# Patient Record
Sex: Female | Born: 1985 | Race: White | Hispanic: No | Smoking: Never smoker
Health system: Southern US, Community
[De-identification: ages and names within clinical notes are randomized; demographics above are authoritative.]

## PROBLEM LIST (undated history)

## (undated) DIAGNOSIS — F419 Anxiety disorder, unspecified: Secondary | ICD-10-CM

## (undated) DIAGNOSIS — I10 Essential (primary) hypertension: Secondary | ICD-10-CM

## (undated) HISTORY — DX: Essential (primary) hypertension: I10

## (undated) HISTORY — DX: Anxiety disorder, unspecified: F41.9

---

## 2017-03-29 ENCOUNTER — Ambulatory Visit (INDEPENDENT_AMBULATORY_CARE_PROVIDER_SITE_OTHER): Payer: No Typology Code available for payment source | Admitting: Obstetrics & Gynecology

## 2017-03-29 ENCOUNTER — Encounter: Payer: Self-pay | Admitting: Obstetrics & Gynecology

## 2017-03-29 VITALS — BP 152/90 | Ht 66.5 in | Wt 181.0 lb

## 2017-03-29 DIAGNOSIS — Z01419 Encounter for gynecological examination (general) (routine) without abnormal findings: Secondary | ICD-10-CM | POA: Diagnosis not present

## 2017-03-29 DIAGNOSIS — Z1151 Encounter for screening for human papillomavirus (HPV): Secondary | ICD-10-CM | POA: Diagnosis not present

## 2017-03-29 DIAGNOSIS — Z308 Encounter for other contraceptive management: Secondary | ICD-10-CM

## 2017-03-29 NOTE — Progress Notes (Signed)
Michele Cunningham M Haros [birth year] [id number]   History:    32 y.o. G0  Married x 2 years.  RP:  New patient presenting for annual gyn exam   HPI: Menstrual periods regular normal every month.  No contraception, would be fine if conceived, but not actively trying.  No pelvic pain. Normal vaginal secretions.  Urine/BMs wnl. Normal Paps in the past, overdue for pap.  No h/o STI.  Breasts wnl.  C/O new onset episodes of SOB/palpitations/anxiety at work which resolve by calming herself down. BMI 28.78.  Rather sedentary.  BP high today, no h/o cHTN, but mother with cHTN.  Father with Cardiac disease.  No Fam h/o Breast Ca, Ovarian or Colon Ca.  Past medical history,surgical history, family history and social history were all reviewed and documented in the EPIC chart.  Gynecologic History Patient's last menstrual period was 03/20/2017. Contraception: none Last Pap: Overdue, normal in the past Last mammogram: Normal Bone Density: Normal Colonoscopy: Normal  Obstetric History OB History  Gravida Para Term Preterm AB Living  0 0 0 0 0 0  SAB TAB Ectopic Multiple Live Births  0 0 0 0 0         ROS: A ROS was performed and pertinent positives and negatives are included in the history.  GENERAL: No fevers or chills. HEENT: No change in vision, no earache, sore throat or sinus congestion. NECK: No pain or stiffness. CARDIOVASCULAR: No chest pain or pressure. No palpitations. PULMONARY: No shortness of breath, cough or wheeze. GASTROINTESTINAL: No abdominal pain, nausea, vomiting or diarrhea, melena or bright red blood per rectum. GENITOURINARY: No urinary frequency, urgency, hesitancy or dysuria. MUSCULOSKELETAL: No joint or muscle pain, no back pain, no recent trauma. DERMATOLOGIC: No rash, no itching, no lesions. ENDOCRINE: No polyuria, polydipsia, no heat or cold intolerance. No recent change in weight. HEMATOLOGICAL: No anemia or easy bruising or bleeding. NEUROLOGIC: No headache, seizures, numbness,  tingling or weakness. PSYCHIATRIC: No depression, no loss of interest in normal activity or change in sleep pattern.     Exam:   BP (!) 158/90   Ht 5' 6.5" (1.689 m)   Wt 181 lb (82.1 kg)   LMP 03/20/2017 Comment: no birth control   BMI 28.78 kg/m   Body mass index is 28.78 kg/m.  General appearance : Well developed well nourished female. No acute distress HEENT: Eyes: no retinal hemorrhage or exudates,  Neck supple, trachea midline, no carotid bruits, no thyroidmegaly Lungs: Clear to auscultation, no rhonchi or wheezes, or rib retractions  Heart: Regular rate and rhythm, no murmurs or gallops Breast:Examined in sitting and supine position were symmetrical in appearance, no palpable masses or tenderness,  no skin retraction, no nipple inversion, no nipple discharge, no skin discoloration, no axillary or supraclavicular lymphadenopathy Abdomen: no palpable masses or tenderness, no rebound or guarding Extremities: no edema or skin discoloration or tenderness  Pelvic: Vulva: Normal             Vagina: No gross lesions or discharge  Cervix: No gross lesions or discharge.  Pap/HR HPV done  Uterus  AV, normal size, shape and consistency, non-tender and mobile  Adnexa  Without masses or tenderness  Anus: Normal   Assessment/Plan:  32 y.o. female for annual exam   1. Encounter for routine gynecological examination with Papanicolaou smear of cervix Normal gynecologic exam.  Pap with high risk HPV done.  Breast exam normal.  Health labs with family physician. - PAP,TP IMGw/HPV RNA,rflx [id number],78/[id number],18/45  2. Special screening examination for human papillomavirus (HPV)  - PAP,TP IMGw/HPV RNA,rflx [id number],18/45  3. Encounter for other contraceptive management Declines contraception after counseling.  Patient is not actively trying to conceive but will welcome the pregnancy.  Genia Del MD, 2:14 PM 03/29/2017

## 2017-03-31 LAB — PAP, TP IMAGING W/ HPV RNA, RFLX HPV TYPE 16,18/45: HPV DNA High Risk: NOT DETECTED

## 2017-04-03 ENCOUNTER — Encounter: Payer: Self-pay | Admitting: Obstetrics & Gynecology

## 2017-04-03 NOTE — Patient Instructions (Signed)
1. Encounter for routine gynecological examination with Papanicolaou smear of cervix Normal gynecologic exam.  Pap with high risk HPV done.  Breast exam normal.  Health labs with family physician. - PAP,TP IMGw/HPV RNA,rflx [id number],17/40  2. Special screening examination for human papillomavirus (HPV)  - PAP,TP IMGw/HPV RNA,rflx [id number],31/49  3. Encounter for other contraceptive management Declines contraception after counseling.  Patient is not actively trying to conceive but will welcome the pregnancy.  Michele Cunningham, it was a pleasure meeting you today!  I will inform you of your results as soon as they are available.  Health Maintenance, Female Adopting a healthy lifestyle and getting preventive care can go a long way to promote health and wellness. Talk with your health care provider about what schedule of regular examinations is right for you. This is a good chance for you to check in with your provider about disease prevention and staying healthy. In between checkups, there are plenty of things you can do on your own. Experts have done a lot of research about which lifestyle changes and preventive measures are most likely to keep you healthy. Ask your health care provider for more information. Weight and diet Eat a healthy diet  Be sure to include plenty of vegetables, fruits, low-fat dairy products, and lean protein.  Do not eat a lot of foods high in solid fats, added sugars, or salt.  Get regular exercise. This is one of the most important things you can do for your health. ? Most adults should exercise for at least 150 minutes each week. The exercise should increase your heart rate and make you sweat (moderate-intensity exercise). ? Most adults should also do strengthening exercises at least twice a week. This is in addition to the moderate-intensity exercise.  Maintain a healthy weight  Body mass index (BMI) is a measurement that can be used to identify possible weight problems.  It estimates body fat based on height and weight. Your health care provider can help determine your BMI and help you achieve or maintain a healthy weight.  For females 80 years of age and older: ? A BMI below 18.5 is considered underweight. ? A BMI of 18.5 to 24.9 is normal. ? A BMI of 25 to 29.9 is considered overweight. ? A BMI of 30 and above is considered obese.  Watch levels of cholesterol and blood lipids  You should start having your blood tested for lipids and cholesterol at 34 years of age, then have this test every 5 years.  You may need to have your cholesterol levels checked more often if: ? Your lipid or cholesterol levels are high. ? You are older than 34 years of age. ? You are at high risk for heart disease.  Cancer screening Lung Cancer  Lung cancer screening is recommended for adults 67-13 years old who are at high risk for lung cancer because of a history of smoking.  A yearly low-dose CT scan of the lungs is recommended for people who: ? Currently smoke. ? Have quit within the past 15 years. ? Have at least a 30-pack-year history of smoking. A pack year is smoking an average of one pack of cigarettes a day for 1 year.  Yearly screening should continue until it has been 15 years since you quit.  Yearly screening should stop if you develop a health problem that would prevent you from having lung cancer treatment.  Breast Cancer  Practice breast self-awareness. This means understanding how your breasts normally appear and feel.  It also means doing regular breast self-exams. Let your health care provider know about any changes, no matter how small.  If you are in your 20s or 30s, you should have a clinical breast exam (CBE) by a health care provider every 1-3 years as part of a regular health exam.  If you are 57 or older, have a CBE every year. Also consider having a breast X-ray (mammogram) every year.  If you have a family history of breast cancer, talk to  your health care provider about genetic screening.  If you are at high risk for breast cancer, talk to your health care provider about having an MRI and a mammogram every year.  Breast cancer gene (BRCA) assessment is recommended for women who have family members with BRCA-related cancers. BRCA-related cancers include: ? Breast. ? Ovarian. ? Tubal. ? Peritoneal cancers.  Results of the assessment will determine the need for genetic counseling and BRCA1 and BRCA2 testing.  Cervical Cancer Your health care provider may recommend that you be screened regularly for cancer of the pelvic organs (ovaries, uterus, and vagina). This screening involves a pelvic examination, including checking for microscopic changes to the surface of your cervix (Pap test). You may be encouraged to have this screening done every 3 years, beginning at age 64.  For women ages 64-65, health care providers may recommend pelvic exams and Pap testing every 3 years, or they may recommend the Pap and pelvic exam, combined with testing for human papilloma virus (HPV), every 5 years. Some types of HPV increase your risk of cervical cancer. Testing for HPV may also be done on women of any age with unclear Pap test results.  Other health care providers may not recommend any screening for nonpregnant women who are considered low risk for pelvic cancer and who do not have symptoms. Ask your health care provider if a screening pelvic exam is right for you.  If you have had past treatment for cervical cancer or a condition that could lead to cancer, you need Pap tests and screening for cancer for at least 20 years after your treatment. If Pap tests have been discontinued, your risk factors (such as having a new sexual partner) need to be reassessed to determine if screening should resume. Some women have medical problems that increase the chance of getting cervical cancer. In these cases, your health care provider may recommend more  frequent screening and Pap tests.  Colorectal Cancer  This type of cancer can be detected and often prevented.  Routine colorectal cancer screening usually begins at 34 years of age and continues through 34 years of age.  Your health care provider may recommend screening at an earlier age if you have risk factors for colon cancer.  Your health care provider may also recommend using home test kits to check for hidden blood in the stool.  A small camera at the end of a tube can be used to examine your colon directly (sigmoidoscopy or colonoscopy). This is done to check for the earliest forms of colorectal cancer.  Routine screening usually begins at age 4.  Direct examination of the colon should be repeated every 5-10 years through 34 years of age. However, you may need to be screened more often if early forms of precancerous polyps or small growths are found.  Skin Cancer  Check your skin from head to toe regularly.  Tell your health care provider about any new moles or changes in moles, especially if there is a  change in a mole's shape or color.  Also tell your health care provider if you have a mole that is larger than the size of a pencil eraser.  Always use sunscreen. Apply sunscreen liberally and repeatedly throughout the day.  Protect yourself by wearing long sleeves, pants, a wide-brimmed hat, and sunglasses whenever you are outside.  Heart disease, diabetes, and high blood pressure  High blood pressure causes heart disease and increases the risk of stroke. High blood pressure is more likely to develop in: ? People who have blood pressure in the high end of the normal range (130-139/85-89 mm Hg). ? People who are overweight or obese. ? People who are African American.  If you are 72-35 years of age, have your blood pressure checked every 3-5 years. If you are 90 years of age or older, have your blood pressure checked every year. You should have your blood pressure measured  twice-once when you are at a hospital or clinic, and once when you are not at a hospital or clinic. Record the average of the two measurements. To check your blood pressure when you are not at a hospital or clinic, you can use: ? An automated blood pressure machine at a pharmacy. ? A home blood pressure monitor.  If you are between 79 years and 11 years old, ask your health care provider if you should take aspirin to prevent strokes.  Have regular diabetes screenings. This involves taking a blood sample to check your fasting blood sugar level. ? If you are at a normal weight and have a low risk for diabetes, have this test once every three years after 34 years of age. ? If you are overweight and have a high risk for diabetes, consider being tested at a younger age or more often. Preventing infection Hepatitis B  If you have a higher risk for hepatitis B, you should be screened for this virus. You are considered at high risk for hepatitis B if: ? You were born in a country where hepatitis B is common. Ask your health care provider which countries are considered high risk. ? Your parents were born in a high-risk country, and you have not been immunized against hepatitis B (hepatitis B vaccine). ? You have HIV or AIDS. ? You use needles to inject street drugs. ? You live with someone who has hepatitis B. ? You have had sex with someone who has hepatitis B. ? You get hemodialysis treatment. ? You take certain medicines for conditions, including cancer, organ transplantation, and autoimmune conditions.  Hepatitis C  Blood testing is recommended for: ? Everyone born from [birth year] through 1967. ? Anyone with known risk factors for hepatitis C.  Sexually transmitted infections (STIs)  You should be screened for sexually transmitted infections (STIs) including gonorrhea and chlamydia if: ? You are sexually active and are younger than 34 years of age. ? You are older than 34 years of age and your  health care provider tells you that you are at risk for this type of infection. ? Your sexual activity has changed since you were last screened and you are at an increased risk for chlamydia or gonorrhea. Ask your health care provider if you are at risk.  If you do not have HIV, but are at risk, it may be recommended that you take a prescription medicine daily to prevent HIV infection. This is called pre-exposure prophylaxis (PrEP). You are considered at risk if: ? You are sexually active and do not regularly  use condoms or know the HIV status of your partner(s). ? You take drugs by injection. ? You are sexually active with a partner who has HIV.  Talk with your health care provider about whether you are at high risk of being infected with HIV. If you choose to begin PrEP, you should first be tested for HIV. You should then be tested every 3 months for as long as you are taking PrEP. Pregnancy  If you are premenopausal and you may become pregnant, ask your health care provider about preconception counseling.  If you may become pregnant, take 400 to 800 micrograms (mcg) of folic acid every day.  If you want to prevent pregnancy, talk to your health care provider about birth control (contraception). Osteoporosis and menopause  Osteoporosis is a disease in which the bones lose minerals and strength with aging. This can result in serious bone fractures. Your risk for osteoporosis can be identified using a bone density scan.  If you are 16 years of age or older, or if you are at risk for osteoporosis and fractures, ask your health care provider if you should be screened.  Ask your health care provider whether you should take a calcium or vitamin D supplement to lower your risk for osteoporosis.  Menopause may have certain physical symptoms and risks.  Hormone replacement therapy may reduce some of these symptoms and risks. Talk to your health care provider about whether hormone replacement  therapy is right for you. Follow these instructions at home:  Schedule regular health, dental, and eye exams.  Stay current with your immunizations.  Do not use any tobacco products including cigarettes, chewing tobacco, or electronic cigarettes.  If you are pregnant, do not drink alcohol.  If you are breastfeeding, limit how much and how often you drink alcohol.  Limit alcohol intake to no more than 1 drink per day for nonpregnant women. One drink equals 12 ounces of beer, 5 ounces of wine, or 1 ounces of hard liquor.  Do not use street drugs.  Do not share needles.  Ask your health care provider for help if you need support or information about quitting drugs.  Tell your health care provider if you often feel depressed.  Tell your health care provider if you have ever been abused or do not feel safe at home. This information is not intended to replace advice given to you by your health care provider. Make sure you discuss any questions you have with your health care provider. Document Released: 07/13/2010 Document Revised: 06/05/2015 Document Reviewed: 10/01/2014 [employer]vier Interactive Patient Education  [employer].

## 2017-10-27 ENCOUNTER — Ambulatory Visit (INDEPENDENT_AMBULATORY_CARE_PROVIDER_SITE_OTHER): Payer: No Typology Code available for payment source | Admitting: Physician Assistant

## 2017-10-27 ENCOUNTER — Other Ambulatory Visit: Payer: Self-pay

## 2017-10-27 VITALS — BP 188/110 | HR 97 | Temp 98.0°F | Resp 16 | Ht 66.0 in | Wt 185.0 lb

## 2017-10-27 DIAGNOSIS — I10 Essential (primary) hypertension: Secondary | ICD-10-CM | POA: Diagnosis not present

## 2017-10-27 LAB — POCT CBC
Granulocyte percent: 69.4 % (ref 37–80)
HCT, POC: 42.3 % — AB (ref 29–41)
Hemoglobin: 14.1 g/dL — AB (ref 9.5–13.5)
Lymph, poc: 1.9 (ref 0.6–3.4)
MCH, POC: 29 pg (ref 27–31.2)
MCHC: 33.3 g/dL (ref 31.8–35.4)
MCV: 87.1 fL (ref 76–111)
MID (cbc): 0.7 (ref 0–0.9)
MPV: 7.3 fL (ref 0–99.8)
POC Granulocyte: 6 (ref 2–6.9)
POC LYMPH PERCENT: 22.4 %L (ref 10–50)
POC MID %: 8.2 %M (ref 0–12)
Platelet Count, POC: 396 10*3/uL (ref 142–424)
RBC: 4.85 M/uL (ref 4.04–5.48)
RDW, POC: 13.4 %
WBC: 8.7 10*3/uL (ref 4.6–10.2)

## 2017-10-27 LAB — POCT URINALYSIS DIP (MANUAL ENTRY)
Bilirubin, UA: NEGATIVE
Glucose, UA: NEGATIVE mg/dL
Ketones, POC UA: NEGATIVE mg/dL
Leukocytes, UA: NEGATIVE
Nitrite, UA: NEGATIVE
Protein Ur, POC: NEGATIVE mg/dL
Spec Grav, UA: 1.01 (ref 1.010–1.025)
Urobilinogen, UA: 0.2 E.U./dL
pH, UA: 6.5 (ref 5.0–8.0)

## 2017-10-27 LAB — POCT GLYCOSYLATED HEMOGLOBIN (HGB A1C): Hemoglobin A1C: 5.2 % (ref 4.0–5.6)

## 2017-10-27 MED ORDER — LISINOPRIL-HYDROCHLOROTHIAZIDE 10-12.5 MG PO TABS
1.0000 | ORAL_TABLET | Freq: Every day | ORAL | 3 refills | Status: DC
Start: 2017-10-27 — End: 2017-11-16

## 2017-10-27 NOTE — Patient Instructions (Addendum)
Start taking lisinopril-hydrochlorothiazide 10-12.5 MG tablet; Take 1 tablet by mouth daily. Try to take your blood pressure readings at least 2x/week until I see you next. Document these readings and bring them with you at your next visit.  Come back and see me in 1-2 weeks.    Hypertension Hypertension, commonly called high blood pressure, is when the force of blood pumping through the arteries is too strong. The arteries are the blood vessels that carry blood from the heart throughout the body. Hypertension forces the heart to work harder to pump blood and may cause arteries to become narrow or stiff. Having untreated or uncontrolled hypertension can cause heart attacks, strokes, kidney disease, and other problems. A blood pressure reading consists of a higher number over a lower number. Ideally, your blood pressure should be below 120/80. The first ("top") number is called the systolic pressure. It is a measure of the pressure in your arteries as your heart beats. The second ("bottom") number is called the diastolic pressure. It is a measure of the pressure in your arteries as the heart relaxes. What are the causes? The cause of this condition is not known. What increases the risk? Some risk factors for high blood pressure are under your control. Others are not. Factors you can change  Smoking.  Having type 2 diabetes mellitus, high cholesterol, or both.  Not getting enough exercise or physical activity.  Being overweight.  Having too much fat, sugar, calories, or salt (sodium) in your diet.  Drinking too much alcohol. Factors that are difficult or impossible to change  Having chronic kidney disease.  Having a family history of high blood pressure.  Age. Risk increases with age.  Race. You may be at higher risk if you are African-American.  Gender. Men are at higher risk than women before age 12. After age 61, women are at higher risk than men.  Having obstructive sleep  apnea.  Stress. What are the signs or symptoms? Extremely high blood pressure (hypertensive crisis) may cause:  Headache.  Anxiety.  Shortness of breath.  Nosebleed.  Nausea and vomiting.  Severe chest pain.  Jerky movements you cannot control (seizures).  How is this treated? This condition is treated by making healthy lifestyle changes, such as eating healthy foods, exercising more, and reducing your alcohol intake. Your health care provider may prescribe medicine if lifestyle changes are not enough to get your blood pressure under control, and if:  Your systolic blood pressure is above 130.  Your diastolic blood pressure is above 80.  Your personal target blood pressure may vary depending on your medical conditions, your age, and other factors. Follow these instructions at home: Eating and drinking  Eat a diet that is high in fiber and potassium, and low in sodium, added sugar, and fat. An example eating plan is called the DASH (Dietary Approaches to Stop Hypertension) diet. To eat this way: ? Eat plenty of fresh fruits and vegetables. Try to fill half of your plate at each meal with fruits and vegetables. ? Eat whole grains, such as whole wheat pasta, brown rice, or whole grain bread. Fill about one quarter of your plate with whole grains. ? Eat or drink low-fat dairy products, such as skim milk or low-fat yogurt. ? Avoid fatty cuts of meat, processed or cured meats, and poultry with skin. Fill about one quarter of your plate with lean proteins, such as fish, chicken without skin, beans, eggs, and tofu. ? Avoid premade and processed foods. These  tend to be higher in sodium, added sugar, and fat.  Reduce your daily sodium intake. Most people with hypertension should eat less than 1,500 mg of sodium a day.  Limit alcohol intake to no more than 1 drink a day for nonpregnant women and 2 drinks a day for men. One drink equals 12 oz of beer, 5 oz of wine, or 1 oz of hard  liquor. Lifestyle  Work with your health care provider to maintain a healthy body weight or to lose weight. Ask what an ideal weight is for you.  Get at least 30 minutes of exercise that causes your heart to beat faster (aerobic exercise) most days of the week. Activities may include walking, swimming, or biking.  Include exercise to strengthen your muscles (resistance exercise), such as pilates or lifting weights, as part of your weekly exercise routine. Try to do these types of exercises for 30 minutes at least 3 days a week.  Do not use any products that contain nicotine or tobacco, such as cigarettes and e-cigarettes. If you need help quitting, ask your health care provider.  Monitor your blood pressure at home as told by your health care provider.  Keep all follow-up visits as told by your health care provider. This is important. Medicines  Take over-the-counter and prescription medicines only as told by your health care provider. Follow directions carefully. Blood pressure medicines must be taken as prescribed.  Do not skip doses of blood pressure medicine. Doing this puts you at risk for problems and can make the medicine less effective.  Ask your health care provider about side effects or reactions to medicines that you should watch for. Contact a health care provider if:  You think you are having a reaction to a medicine you are taking.  You have headaches that keep coming back (recurring).  You feel dizzy.  You have swelling in your ankles.  You have trouble with your vision. Get help right away if:  You develop a severe headache or confusion.  You have unusual weakness or numbness.  You feel faint.  You have severe pain in your chest or abdomen.  You vomit repeatedly.  You have trouble breathing. Summary  Hypertension is when the force of blood pumping through your arteries is too strong. If this condition is not controlled, it may put you at risk for serious  complications.  Your personal target blood pressure may vary depending on your medical conditions, your age, and other factors. For most people, a normal blood pressure is less than 120/80.  Hypertension is treated with lifestyle changes, medicines, or a combination of both. Lifestyle changes include weight loss, eating a healthy, low-sodium diet, exercising more, and limiting alcohol. This information is not intended to replace advice given to you by your health care provider. Make sure you discuss any questions you have with your health care provider. Document Released: 12/28/2004 Document Revised: 11/26/2015 Document Reviewed: 11/26/2015 Elsevier Interactive Patient Education  2018 [employer].   DASH Eating Plan DASH stands for "Dietary Approaches to Stop Hypertension." The DASH eating plan is a healthy eating plan that has been shown to reduce high blood pressure (hypertension). It may also reduce your risk for type 2 diabetes, heart disease, and stroke. The DASH eating plan may also help with weight loss. What are tips for following this plan? General guidelines  Avoid eating more than 2,300 mg (milligrams) of salt (sodium) a day. If you have hypertension, you may need to reduce your sodium  intake to 1,500 mg a day.  Limit alcohol intake to no more than 1 drink a day for nonpregnant women and 2 drinks a day for men. One drink equals 12 oz of beer, 5 oz of wine, or 1 oz of hard liquor.  Work with your health care provider to maintain a healthy body weight or to lose weight. Ask what an ideal weight is for you.  Get at least 30 minutes of exercise that causes your heart to beat faster (aerobic exercise) most days of the week. Activities may include walking, swimming, or biking.  Work with your health care provider or diet and nutrition specialist (dietitian) to adjust your eating plan to your individual calorie needs. Reading food labels  Check food labels for the amount of sodium  per serving. Choose foods with less than 5 percent of the Daily Value of sodium. Generally, foods with less than 300 mg of sodium per serving fit into this eating plan.  To find whole grains, look for the word "whole" as the first word in the ingredient list. Shopping  Buy products labeled as "low-sodium" or "no salt added."  Buy fresh foods. Avoid canned foods and premade or frozen meals. Cooking  Avoid adding salt when cooking. Use salt-free seasonings or herbs instead of table salt or sea salt. Check with your health care provider or pharmacist before using salt substitutes.  Do not fry foods. Cook foods using healthy methods such as baking, boiling, grilling, and broiling instead.  Cook with heart-healthy oils, such as olive, canola, soybean, or sunflower oil. Meal planning   Eat a balanced diet that includes: ? 5 or more servings of fruits and vegetables each day. At each meal, try to fill half of your plate with fruits and vegetables. ? Up to 6-8 servings of whole grains each day. ? Less than 6 oz of lean meat, poultry, or fish each day. A 3-oz serving of meat is about the same size as a deck of cards. One egg equals 1 oz. ? 2 servings of low-fat dairy each day. ? A serving of nuts, seeds, or beans 5 times each week. ? Heart-healthy fats. Healthy fats called Omega-3 fatty acids are found in foods such as flaxseeds and coldwater fish, like sardines, salmon, and mackerel.  Limit how much you eat of the following: ? Canned or prepackaged foods. ? Food that is high in trans fat, such as fried foods. ? Food that is high in saturated fat, such as fatty meat. ? Sweets, desserts, sugary drinks, and other foods with added sugar. ? Full-fat dairy products.  Do not salt foods before eating.  Try to eat at least 2 vegetarian meals each week.  Eat more home-cooked food and less restaurant, buffet, and fast food.  When eating at a restaurant, ask that your food be prepared with less  salt or no salt, if possible. What foods are recommended? The items listed may not be a complete list. Talk with your dietitian about what dietary choices are best for you. Grains Whole-grain or whole-wheat bread. Whole-grain or whole-wheat pasta. Brown rice. Orpah Cobb. Bulgur. Whole-grain and low-sodium cereals. Pita bread. Low-fat, low-sodium crackers. Whole-wheat flour tortillas. Vegetables Fresh or frozen vegetables (raw, steamed, roasted, or grilled). Low-sodium or reduced-sodium tomato and vegetable juice. Low-sodium or reduced-sodium tomato sauce and tomato paste. Low-sodium or reduced-sodium canned vegetables. Fruits All fresh, dried, or frozen fruit. Canned fruit in natural juice (without added sugar). Meat and other protein foods Skinless chicken or [place].  Ground chicken or [place]. Pork with fat trimmed off. Fish and seafood. Egg whites. Dried beans, peas, or lentils. Unsalted nuts, nut butters, and seeds. Unsalted canned beans. Lean cuts of beef with fat trimmed off. Low-sodium, lean deli meat. Dairy Low-fat (1%) or fat-free (skim) milk. Fat-free, low-fat, or reduced-fat cheeses. Nonfat, low-sodium ricotta or cottage cheese. Low-fat or nonfat yogurt. Low-fat, low-sodium cheese. Fats and oils Soft margarine without trans fats. Vegetable oil. Low-fat, reduced-fat, or light mayonnaise and salad dressings (reduced-sodium). Canola, safflower, olive, soybean, and sunflower oils. Avocado. Seasoning and other foods Herbs. Spices. Seasoning mixes without salt. Unsalted popcorn and pretzels. Fat-free sweets. What foods are not recommended? The items listed may not be a complete list. Talk with your dietitian about what dietary choices are best for you. Grains Baked goods made with fat, such as croissants, muffins, or some breads. Dry pasta or rice meal packs. Vegetables Creamed or fried vegetables. Vegetables in a cheese sauce. Regular canned vegetables (not low-sodium or  reduced-sodium). Regular canned tomato sauce and paste (not low-sodium or reduced-sodium). Regular tomato and vegetable juice (not low-sodium or reduced-sodium). Rosita Fire. Olives. Fruits Canned fruit in a light or heavy syrup. Fried fruit. Fruit in cream or butter sauce. Meat and other protein foods Fatty cuts of meat. Ribs. Fried meat. Tomasa Blase. Sausage. Bologna and other processed lunch meats. Salami. Fatback. Hotdogs. Bratwurst. Salted nuts and seeds. Canned beans with added salt. Canned or smoked fish. Whole eggs or egg yolks. Chicken or [place] with skin. Dairy Whole or 2% milk, cream, and half-and-half. Whole or full-fat cream cheese. Whole-fat or sweetened yogurt. Full-fat cheese. Nondairy creamers. Whipped toppings. Processed cheese and cheese spreads. Fats and oils Butter. Stick margarine. Lard. Shortening. Ghee. Bacon fat. Tropical oils, such as coconut, palm kernel, or palm oil. Seasoning and other foods Salted popcorn and pretzels. Onion salt, garlic salt, seasoned salt, table salt, and sea salt. Worcestershire sauce. Tartar sauce. Barbecue sauce. Teriyaki sauce. Soy sauce, including reduced-sodium. Steak sauce. Canned and packaged gravies. Fish sauce. Oyster sauce. Cocktail sauce. Horseradish that you find on the shelf. Ketchup. Mustard. Meat flavorings and tenderizers. Bouillon cubes. Hot sauce and Tabasco sauce. Premade or packaged marinades. Premade or packaged taco seasonings. Relishes. Regular salad dressings. Where to find more information:  National Heart, Lung, and Blood Institute: PopSteam.is  American Heart Association: www.heart.org Summary  The DASH eating plan is a healthy eating plan that has been shown to reduce high blood pressure (hypertension). It may also reduce your risk for type 2 diabetes, heart disease, and stroke.  With the DASH eating plan, you should limit salt (sodium) intake to 2,300 mg a day. If you have hypertension, you may need to reduce your sodium  intake to 1,500 mg a day.  When on the DASH eating plan, aim to eat more fresh fruits and vegetables, whole grains, lean proteins, low-fat dairy, and heart-healthy fats.  Work with your health care provider or diet and nutrition specialist (dietitian) to adjust your eating plan to your individual calorie needs. This information is not intended to replace advice given to you by your health care provider. Make sure you discuss any questions you have with your health care provider. Document Released: 12/17/2010 Document Revised: 12/22/2015 Document Reviewed: 12/22/2015 Elsevier Interactive Patient Education  [employer].   If you have lab work done today you will be contacted with your lab results within the next 2 weeks.  If you have not heard from [place] then please contact us. The fastest way to  get your results is to register for My Chart.   IF you received an x-ray today, you will receive an invoice from [facility]. Please contact [facility] at [phone] with questions or concerns regarding your invoice.   IF you received labwork today, you will receive an invoice from [facility]. Please contact [facility] at [phone] with questions or concerns regarding your invoice.   Our billing staff will not be able to assist you with questions regarding bills from these companies.  You will be contacted with the lab results as soon as they are available. The fastest way to get your results is to activate your My Chart account. Instructions are located on the last page of this paperwork. If you have not heard from [place] regarding the results in 2 weeks, please contact this office.

## 2017-10-27 NOTE — Progress Notes (Signed)
   Michele Cunningham  MRN: [id number] DOB: 15-Jul-1985  PCP: Patient, No Pcp Per  Subjective:  Pt is a 32 year old female who presents to clinic for HA, blurry vision and anxiety.   Blurry vision x 1-2 months. "Looked like something was floating and it got really bad".  Migraines usually over right eye. Never on left side. Happens about 3x/month. Lasts about 8 hours.  She has h/o high blood pressure - [facility] and was told "blood pressure was a little high". Prior to that does not recall ever being told about elevated blood pressure.    Elevated blood pressure reading with GYN 03/2017 of 152/90 No medications.  Diet: not that healthy. Chicken, okra, pizza, pasta. Cooks at home. Occasional canned soup.  Non-smoker.  No big stressors in her life. Drinks alcohol on weekends mostly. Beer, wine, liquor.    FHx - mother HTN. Sister "I don't think she has high blood pressure"   Pt  has no past medical history on file.  Review of Systems  Eyes: Positive for visual disturbance.  Cardiovascular: Negative for chest pain, palpitations and leg swelling.  Neurological: Positive for headaches. Negative for dizziness and light-headedness.    There are no active problems to display for this patient.   Current Outpatient Medications on File Prior to Visit  Medication Sig Dispense Refill  . Multiple Vitamins-Minerals (HAIR/SKIN/NAILS/BIOTIN PO) Take by mouth.     No current facility-administered medications on file prior to visit.     No Known Allergies   Objective:  BP (!) 188/110   Pulse 97   Temp 98 F (36.7 C) (Oral)   Resp 16   Ht _0  (1.676 m)   Wt 185 lb (83.9 kg)   LMP 10/22/2017   SpO2 98%   BMI 29.86 kg/m   Physical Exam  Constitutional: She is oriented to person, place, and time. No distress.  Cardiovascular: Normal rate, regular rhythm and normal heart sounds.  Neurological: She is alert and oriented to person, place, and time.    Skin: Skin is warm and dry.  Psychiatric: Judgment normal.  Vitals reviewed.   Assessment and Plan :  1. Essential hypertension - pt presents with elevated blood pressure and c/o HA with episodes of visual changes. She has been told in the past that her blood pressure was "a little high". She has never been treated for this in the past. Blood pressure today is 188/110. No siblings with HTN. Plan to start prinzide 10-12.61m qd. RTC in 1-2 weeks for recheck. Consider imaging to r/o RAS.  - Lipid panel - POCT CBC - POCT glycosylated hemoglobin (Hb A1C) - POCT urinalysis dipstick - TSH - CMP14+EGFR - lisinopril-hydrochlorothiazide (PRINZIDE,ZESTORETIC) 10-12.5 MG tablet; Take 1 tablet by mouth daily.  Dispense: 30 tablet; Refill: 3   Whitney Wayburn Shaler, PA-C  [facility]/17/2019 2:59 PM  Please note: Portions of this report may have been transcribed using dragon voice recognition software. Every effort was made to ensure accuracy; however, inadvertent computerized transcription errors may be present.

## 2017-10-28 LAB — CMP14+EGFR
ALT: 35 IU/L — ABNORMAL HIGH (ref 0–32)
AST: 48 IU/L — ABNORMAL HIGH (ref 0–40)
Albumin/Globulin Ratio: 1.8 (ref 1.2–2.2)
Albumin: 5.1 g/dL (ref 3.5–5.5)
Alkaline Phosphatase: 54 IU/L (ref 39–117)
BUN/Creatinine Ratio: 14 (ref 9–23)
BUN: 11 mg/dL (ref 6–20)
Bilirubin Total: 0.3 mg/dL (ref 0.0–1.2)
CO2: 22 mmol/L (ref 20–29)
Calcium: 11.2 mg/dL — ABNORMAL HIGH (ref 8.7–10.2)
Chloride: 99 mmol/L (ref 96–106)
Creatinine, Ser: 0.77 mg/dL (ref 0.57–1.00)
GFR calc Af Amer: 117 mL/min/{1.73_m2} (ref >59)
GFR calc non Af Amer: 101 mL/min/{1.73_m2} (ref >59)
Globulin, Total: 2.8 g/dL (ref 1.5–4.5)
Glucose: 88 mg/dL (ref 65–99)
Potassium: 4.5 mmol/L (ref 3.5–5.2)
Sodium: 139 mmol/L (ref 134–144)
Total Protein: 7.9 g/dL (ref 6.0–8.5)

## 2017-10-28 LAB — LIPID PANEL
Chol/HDL Ratio: 3.9 ratio (ref 0.0–4.4)
Cholesterol, Total: 219 mg/dL — ABNORMAL HIGH (ref 100–199)
HDL: 56 mg/dL (ref >39)
LDL Calculated: 100 mg/dL — ABNORMAL HIGH (ref 0–99)
Triglycerides: 317 mg/dL — ABNORMAL HIGH (ref 0–149)
VLDL Cholesterol Cal: 63 mg/dL — ABNORMAL HIGH (ref 5–40)

## 2017-10-28 LAB — TSH: TSH: 1.53 u[IU]/mL (ref 0.450–4.500)

## 2017-11-02 ENCOUNTER — Encounter: Payer: Self-pay | Admitting: Physician Assistant

## 2017-11-02 DIAGNOSIS — I10 Essential (primary) hypertension: Secondary | ICD-10-CM | POA: Insufficient documentation

## 2017-11-02 NOTE — Progress Notes (Signed)
Results released to mychart. Tips for lowering cholesterol and liver enzymes discussed. Cardiac risk factors reviewed. Recheck in 6 months.

## 2017-11-16 ENCOUNTER — Other Ambulatory Visit: Payer: Self-pay

## 2017-11-16 ENCOUNTER — Ambulatory Visit (INDEPENDENT_AMBULATORY_CARE_PROVIDER_SITE_OTHER): Payer: No Typology Code available for payment source | Admitting: Physician Assistant

## 2017-11-16 ENCOUNTER — Encounter: Payer: Self-pay | Admitting: Physician Assistant

## 2017-11-16 VITALS — BP 155/91 | HR 84 | Temp 98.6°F | Ht 67.0 in | Wt 183.4 lb

## 2017-11-16 DIAGNOSIS — I10 Essential (primary) hypertension: Secondary | ICD-10-CM

## 2017-11-16 DIAGNOSIS — R945 Abnormal results of liver function studies: Secondary | ICD-10-CM

## 2017-11-16 DIAGNOSIS — R7989 Other specified abnormal findings of blood chemistry: Secondary | ICD-10-CM

## 2017-11-16 DIAGNOSIS — E785 Hyperlipidemia, unspecified: Secondary | ICD-10-CM | POA: Diagnosis not present

## 2017-11-16 MED ORDER — LISINOPRIL-HYDROCHLOROTHIAZIDE 20-12.5 MG PO TABS: 1.0000 | ORAL_TABLET | Freq: Every day | ORAL | 3 refills | Status: DC

## 2017-11-16 NOTE — Progress Notes (Addendum)
Michele Cunningham  MRN: [id number] DOB: 05/12/1985  PCP: Patient, No Pcp Per  Subjective:  Pt is a 32 year old female who presents to clinic for f/u HTN.  She was here 10/17 with blood pressure of 188/110 c/o HA and visual changes. Started Prinzide 10-12.5mg .  Endorses compliance.  Denies medication SE.  Today's blood pressure is 155/91.   Endorses headaches have improved.  No further episodes of visual disturbance.  She has been taking home blood pressures. 160/100, 131/98, 140/94 Since her last appointment she has learned that both her mother and father also take Prinzide for hypertension.  Still does not believe that her sister She recently learned she has a few cousins who are in their early 20's with HTN.   She has cut back on her drinking and is trying to improve her diet.   Lab Results  Component Value Date   ALT 35 (H) 10/27/2017   AST 48 (H) 10/27/2017   ALKPHOS 54 10/27/2017   BILITOT 0.3 10/27/2017   Lab Results  Component Value Date   CHOL 219 (H) 10/27/2017   Lab Results  Component Value Date   HDL 56 10/27/2017   Lab Results  Component Value Date   LDLCALC 100 (H) 10/27/2017   Lab Results  Component Value Date   TRIG 317 (H) 10/27/2017   Lab Results  Component Value Date   CHOLHDL 3.9 10/27/2017   No results found for: LDLDIRECT  Review of Systems  Eyes: Negative for visual disturbance.  Respiratory: Negative for cough and chest tightness.   Cardiovascular: Negative for palpitations and leg swelling.    Patient Active Problem List   Diagnosis Date Noted  . Essential hypertension 11/02/2017    Current Outpatient Medications on File Prior to Visit  Medication Sig Dispense Refill  . lisinopril-hydrochlorothiazide (PRINZIDE,ZESTORETIC) 10-12.5 MG tablet Take 1 tablet by mouth daily. 30 tablet 3  . Multiple Vitamins-Minerals (HAIR/SKIN/NAILS/BIOTIN PO) Take by mouth.     No current facility-administered medications on file prior to visit.     No  Known Allergies   Objective:  BP (!) 155/91 (BP Location: Right Arm, Patient Position: Sitting, Cuff Size: Large)   Pulse 84   Temp 98.6 F (37 C) (Oral)   Ht 5\' 7"  (1.702 m)   Wt 183 lb 6.4 oz (83.2 kg)   LMP 10/22/2017   SpO2 99%   BMI 28.72 kg/m   Physical Exam  Constitutional: She appears well-developed and well-nourished.  Cardiovascular: Normal rate, regular rhythm and normal heart sounds.  Abdominal: She exhibits no abdominal bruit.  Vitals reviewed.   Assessment and Plan :  1. Essential hypertension 2. Hyperlipidemia, unspecified hyperlipidemia type 3. Elevated liver function tests -Patient presents for follow-up hypertension.  She was here 2 weeks ago with a blood pressure of 188/110.  She was started on Zestoretic 10-12.5 mg daily.  Endorses compliance, denies medication side effects.  Symptoms of headache and episodes of blurry vision have improved.  Today's blood pressure is 155/91.  Home blood pressures have also been elevated.  Plan to increase Zestoretic to 20-12.5 mg/day.  Encouraged continued improve diet and exercise. LFT's and lipids were elevated. Cannot r/o HTN 2/2 alcohol consumption. However cannot r/o RAS - plan to get [place]. Return to clinic in 3 weeks for recheck. - lisinopril-hydrochlorothiazide (ZESTORETIC) 20-12.5 MG tablet; Take 1 tablet by mouth daily.  Dispense: 90 tablet; Refill: 3 - US Renal Artery Stenosis; Future   Marco Collie, PA-C  Primary Care at  [facility] 11/16/2017 2:04 PM  Please note: Portions of this report may have been transcribed using dragon voice recognition software. Every effort was made to ensure accuracy; however, inadvertent computerized transcription errors may be present.

## 2017-11-16 NOTE — Patient Instructions (Addendum)
(  Good Rx app) Ask your sister if she has high blood pressure too.   Increase your Prinzide to 20-12.'5mg'$ /day. Take a few blood pressures at home and write them down again.    Today's blood pressure is 155/91.  I'd like to see your blood pressure at or below 135/85  Come back and see me in early Dec for blood pressure recheck.    To help lower cholesterol:  Start taking flaxseed oil (or crushed seeds) daily.   Daily consumption of almonds (42 g/day or about half a cup or 1.5 ounces), walnuts, pistachios alone or in combination with dark chocolate had favorable effects on total cholesterol  Fiber-rich foods, such as fruits, vegetables, beans, and oats, seem to lower cholesterol and are generally good for your health.    A Mediterranean diet appears to reduce the risk of cardiovascular events. There is no single Mediterranean diet, but such diets are typically high in fruits, vegetables, whole grains, beans, nuts, and seeds and include olive oil as an important source of fat; there are typically low to moderate amounts of fish, poultry, and dairy products, and there is little red meat.   Thank you for coming in today. I hope you feel we met your needs.  Feel free to call PCP if you have any questions or further requests.  Please consider signing up for MyChart if you do not already have it, as this is a great way to communicate with me.  Best,  Whitney McVey, PA-C   IF you received an x-ray today, you will receive an invoice from [facility]. Please contact [facility] at [phone] with questions or concerns regarding your invoice.   IF you received labwork today, you will receive an invoice from [place]. Please contact [facility] at [phone] with questions or concerns regarding your invoice.   Our billing staff will not be able to assist you with questions regarding bills from these companies.  You will be contacted with the lab results as soon as they are  available. The fastest way to get your results is to activate your My Chart account. Instructions are located on the last page of this paperwork. If you have not heard from [place] regarding the results in 2 weeks, please contact this office.

## 2017-11-18 NOTE — Addendum Note (Signed)
Addended by: Sebastian Ache on: 11/18/2017 12:50 PM   Modules accepted: Orders

## 2017-12-14 ENCOUNTER — Ambulatory Visit (INDEPENDENT_AMBULATORY_CARE_PROVIDER_SITE_OTHER): Payer: No Typology Code available for payment source | Admitting: Physician Assistant

## 2017-12-14 ENCOUNTER — Other Ambulatory Visit: Payer: Self-pay

## 2017-12-14 ENCOUNTER — Telehealth: Payer: Self-pay | Admitting: Physician Assistant

## 2017-12-14 ENCOUNTER — Encounter: Payer: Self-pay | Admitting: Physician Assistant

## 2017-12-14 VITALS — BP 142/90 | HR 97 | Temp 98.9°F | Resp 18 | Ht 67.0 in | Wt 182.6 lb

## 2017-12-14 DIAGNOSIS — I10 Essential (primary) hypertension: Secondary | ICD-10-CM

## 2017-12-14 DIAGNOSIS — E785 Hyperlipidemia, unspecified: Secondary | ICD-10-CM | POA: Diagnosis not present

## 2017-12-14 MED ORDER — LISINOPRIL-HYDROCHLOROTHIAZIDE 20-25 MG PO TABS: 1.0000 | ORAL_TABLET | Freq: Every day | ORAL | 3 refills | Status: DC

## 2017-12-14 NOTE — Telephone Encounter (Signed)
Whitney wants to see pt on her Saturday 01/21/17 but her Saturday schedule is not open.  She says she'll be in from 8-1.... Please help.Marland Kitchen..Marland Kitchen

## 2017-12-14 NOTE — Progress Notes (Signed)
   Jamse Arnrin M Stadler  MRN: [id number] DOB: [birth year]  PCP: Patient, No Pcp Per  Subjective:  Pt is a pleasant 32 year old female who presents to clinic for HTN f/u.   Today's blood pressure is 151/96, recheck is 142/90. Zestoretic 20-12.5mg  qd. Endorses medication compliance.  Home pressures: 120/85, 132/95, 145/98.  con't to cut back on alcohol.   She found out her 34 yo sister also has HTN. Unknown duration.    Review of Systems  Constitutional: Negative for diaphoresis and fatigue.  Eyes: Negative for visual disturbance.  Cardiovascular: Negative for chest pain, palpitations and leg swelling.  Neurological: Negative for dizziness, light-headedness and headaches.    Patient Active Problem List   Diagnosis Date Noted  . Essential hypertension 11/02/2017    Current Outpatient Medications on File Prior to Visit  Medication Sig Dispense Refill  . lisinopril-hydrochlorothiazide (ZESTORETIC) 20-12.5 MG tablet Take 1 tablet by mouth daily. 90 tablet 3  . Multiple Vitamins-Minerals (HAIR/SKIN/NAILS/BIOTIN PO) Take by mouth.     No current facility-administered medications on file prior to visit.     No Known Allergies   Objective:  BP (!) 151/96   Pulse 97   Temp 98.9 F (37.2 C) (Oral)   Resp 18   Ht 5\' 7"  (1.702 m)   Wt 182 lb 9.6 oz (82.8 kg)   LMP 11/20/2017   SpO2 100%   BMI 28.60 kg/m   Physical Exam  Constitutional: She is oriented to person, place, and time. No distress.  Cardiovascular: Normal rate, regular rhythm and normal heart sounds.  Neurological: She is alert and oriented to person, place, and time.  Skin: Skin is warm and dry.  Psychiatric: Judgment normal.  Vitals reviewed.   Assessment and Plan :  1. Essential hypertension -  Pt here for f/u HTN. Zestoretic 20-12.5mg  qd. Today's blood pressure is 151/96, recheck is 142/90. She has not yet received a phone call to sched appt for renal US. Will send message to referral pool. Plan to increase to  Zestoretic 20-25mg  qd. Encouraged exercise and healthy diet. Record home blood pressure readings.  RTC in 1 month for recheck.  - Recheck vitals - lisinopril-hydrochlorothiazide (PRINZIDE,ZESTORETIC) 20-25 MG tablet; Take 1 tablet by mouth daily.  Dispense: 90 tablet; Refill: 3  2. Hyperlipidemia, unspecified hyperlipidemia type   Marco CollieWhitney Deby Adger, PA-C  [facility] at [facility] 12/14/2017 4:13 PM  Please note: Portions of this report may have been transcribed using dragon voice recognition software. Every effort was made to ensure accuracy; however, inadvertent computerized transcription errors may be present.

## 2017-12-14 NOTE — Patient Instructions (Addendum)
Start taking Zestoretic 20-25mg  daily.  Check your blood pressure 2-4 times/week and record them. Bring record back at your next OV.  You should receive a phone call to schedule an appointment for your renal ultrasound. I printed out some info below DASH info about this condition. I'd like to rule this out.  Come back and see me in 1 month  Where to find more information:  National Heart, Lung, and Blood Institute: PopSteam.is  American Heart Association: www.heart.org  DASH Eating Plan DASH stands for "Dietary Approaches to Stop Hypertension." The DASH eating plan is a healthy eating plan that has been shown to reduce high blood pressure (hypertension). It may also reduce your risk for type 2 diabetes, heart disease, and stroke. The DASH eating plan may also help with weight loss. What are tips for following this plan? General guidelines  Avoid eating more than 2,300 mg (milligrams) of salt (sodium) a day. If you have hypertension, you may need to reduce your sodium intake to 1,500 mg a day.  Limit alcohol intake to no more than 1 drink a day for nonpregnant women and 2 drinks a day for men. One drink equals 12 oz of beer, 5 oz of wine, or 1 oz of hard liquor.  Work with your health care provider to maintain a healthy body weight or to lose weight. Ask what an ideal weight is for you.  Get at least 30 minutes of exercise that causes your heart to beat faster (aerobic exercise) most days of the week. Activities may include walking, swimming, or biking.  Work with your health care provider or diet and nutrition specialist (dietitian) to adjust your eating plan to your individual calorie needs. Reading food labels  Check food labels for the amount of sodium per serving. Choose foods with less than 5 percent of the Daily Value of sodium. Generally, foods with less than 300 mg of sodium per serving fit into this eating plan.  To find whole grains, look for the word "whole" as the  first word in the ingredient list. Shopping  Buy products labeled as "low-sodium" or "no salt added."  Buy fresh foods. Avoid canned foods and premade or frozen meals. Cooking  Avoid adding salt when cooking. Use salt-free seasonings or herbs instead of table salt or sea salt. Check with your health care provider or pharmacist before using salt substitutes.  Do not fry foods. Cook foods using healthy methods such as baking, boiling, grilling, and broiling instead.  Cook with heart-healthy oils, such as olive, canola, soybean, or sunflower oil. Meal planning   Eat a balanced diet that includes: ? 5 or more servings of fruits and vegetables each day. At each meal, try to fill half of your plate with fruits and vegetables. ? Up to 6-8 servings of whole grains each day. ? Less than 6 oz of lean meat, poultry, or fish each day. A 3-oz serving of meat is about the same size as a deck of cards. One egg equals 1 oz. ? 2 servings of low-fat dairy each day. ? A serving of nuts, seeds, or beans 5 times each week. ? Heart-healthy fats. Healthy fats called Omega-3 fatty acids are found in foods such as flaxseeds and coldwater fish, like sardines, salmon, and mackerel.  Limit how much you eat of the following: ? Canned or prepackaged foods. ? Food that is high in trans fat, such as fried foods. ? Food that is high in saturated fat, such as fatty meat. ?  Sweets, desserts, sugary drinks, and other foods with added sugar. ? Full-fat dairy products.  Do not salt foods before eating.  Try to eat at least 2 vegetarian meals each week.  Eat more home-cooked food and less restaurant, buffet, and fast food.  When eating at a restaurant, ask that your food be prepared with less salt or no salt, if possible. What foods are recommended? The items listed may not be a complete list. Talk with your dietitian about what dietary choices are best for you. Grains Whole-grain or whole-wheat bread. Whole-grain  or whole-wheat pasta. Brown rice. Orpah Cobb. Bulgur. Whole-grain and low-sodium cereals. Pita bread. Low-fat, low-sodium crackers. Whole-wheat flour tortillas. Vegetables Fresh or frozen vegetables (raw, steamed, roasted, or grilled). Low-sodium or reduced-sodium tomato and vegetable juice. Low-sodium or reduced-sodium tomato sauce and tomato paste. Low-sodium or reduced-sodium canned vegetables. Fruits All fresh, dried, or frozen fruit. Canned fruit in natural juice (without added sugar). Meat and other protein foods Skinless chicken or [place]. Ground chicken or [place]. Pork with fat trimmed off. Fish and seafood. Egg whites. Dried beans, peas, or lentils. Unsalted nuts, nut butters, and seeds. Unsalted canned beans. Lean cuts of beef with fat trimmed off. Low-sodium, lean deli meat. Dairy Low-fat (1%) or fat-free (skim) milk. Fat-free, low-fat, or reduced-fat cheeses. Nonfat, low-sodium ricotta or cottage cheese. Low-fat or nonfat yogurt. Low-fat, low-sodium cheese. Fats and oils Soft margarine without trans fats. Vegetable oil. Low-fat, reduced-fat, or light mayonnaise and salad dressings (reduced-sodium). Canola, safflower, olive, soybean, and sunflower oils. Avocado. Seasoning and other foods Herbs. Spices. Seasoning mixes without salt. Unsalted popcorn and pretzels. Fat-free sweets. What foods are not recommended? The items listed may not be a complete list. Talk with your dietitian about what dietary choices are best for you. Grains Baked goods made with fat, such as croissants, muffins, or some breads. Dry pasta or rice meal packs. Vegetables Creamed or fried vegetables. Vegetables in a cheese sauce. Regular canned vegetables (not low-sodium or reduced-sodium). Regular canned tomato sauce and paste (not low-sodium or reduced-sodium). Regular tomato and vegetable juice (not low-sodium or reduced-sodium). Rosita Fire. Olives. Fruits Canned fruit in a light or heavy syrup. Fried fruit.  Fruit in cream or butter sauce. Meat and other protein foods Fatty cuts of meat. Ribs. Fried meat. Tomasa Blase. Sausage. Bologna and other processed lunch meats. Salami. Fatback. Hotdogs. Bratwurst. Salted nuts and seeds. Canned beans with added salt. Canned or smoked fish. Whole eggs or egg yolks. Chicken or [place] with skin. Dairy Whole or 2% milk, cream, and half-and-half. Whole or full-fat cream cheese. Whole-fat or sweetened yogurt. Full-fat cheese. Nondairy creamers. Whipped toppings. Processed cheese and cheese spreads. Fats and oils Butter. Stick margarine. Lard. Shortening. Ghee. Bacon fat. Tropical oils, such as coconut, palm kernel, or palm oil. Seasoning and other foods Salted popcorn and pretzels. Onion salt, garlic salt, seasoned salt, table salt, and sea salt. Worcestershire sauce. Tartar sauce. Barbecue sauce. Teriyaki sauce. Soy sauce, including reduced-sodium. Steak sauce. Canned and packaged gravies. Fish sauce. Oyster sauce. Cocktail sauce. Horseradish that you find on the shelf. Ketchup. Mustard. Meat flavorings and tenderizers. Bouillon cubes. Hot sauce and Tabasco sauce. Premade or packaged marinades. Premade or packaged taco seasonings. Relishes. Regular salad dressings. Summary  The DASH eating plan is a healthy eating plan that has been shown to reduce high blood pressure (hypertension). It may also reduce your risk for type 2 diabetes, heart disease, and stroke.  With the DASH eating plan, you should limit salt (sodium) intake to 2,300  mg a day. If you have hypertension, you may need to reduce your sodium intake to 1,500 mg a day.  When on the DASH eating plan, aim to eat more fresh fruits and vegetables, whole grains, lean proteins, low-fat dairy, and heart-healthy fats.  Work with your health care provider or diet and nutrition specialist (dietitian) to adjust your eating plan to your individual calorie needs. This information is not intended to replace advice given to you by  your health care provider. Make sure you discuss any questions you have with your health care provider. Document Released: 12/17/2010 Document Revised: 12/22/2015 Document Reviewed: 12/22/2015 Elsevier Interactive Patient Education  2018 Elsevier Inc.  Renal Artery Stenosis Renal artery stenosis (RAS) is narrowing of the artery that carries blood to your kidneys. It can affect one or both kidneys. Your kidneys filter waste and extra fluid from your blood. You get rid of the waste and fluid when you urinate. Your kidneys also make an important chemical messenger (hormone) called renin. Renin helps regulate your blood pressure. The first sign of RAS may be high blood pressure. Over time, other symptoms can develop. What are the causes? Plaque buildup in your arteries (atherosclerosis) is the main cause of RAS. The plaques that cause this are made up of:  Fat.  Cholesterol.  Calcium.  Other substances.  As these substances build up in your renal artery, this slows the blood supply to your kidneys. The lack of blood and oxygen causes the signs and symptoms of RAS. A much less common cause of RAS is a disease called fibromuscular dysplasia. This disease causes abnormal cell growth that narrows the renal artery. It is not related to atherosclerosis. It occurs mostly in women who are 45-17 years old. It may be passed down through families. What increases the risk? You may be at risk for renal artery stenosis if you:  Are a man who is at least 34 years old.  Are a woman who is at least 34 years old.  Have high blood pressure.  Have high cholesterol.  Are a smoker.  Abuse alcohol.  Have diabetes or prediabetes.  Are overweight.  Have a family history of early heart disease.  What are the signs or symptoms? RAS usually develops slowly. You may not have any signs or symptoms at first. The earliest signs may be:  Developing high blood pressure.  A sudden increase in existing high  blood pressure.  No longer responding to medicine that used to control your blood pressure.  Later signs and symptoms are due to kidney damage. They may include:  Fatigue.  Shortness of breath.  Swollen legs and feet.  Dry skin.  Headaches.  Muscle cramps.  Loss of appetite.  Nausea or vomiting.  How is this diagnosed? Your health care provider may suspect RAS based on changes in your blood pressure and your risk factors. A physical exam will be done. Your health care provider may use a stethoscope to listen for a whooshing sound (bruit) that can occur where the renal artery is blocking blood flow. Several tests may be done to confirm a diagnosis of RAS. These may include:  Blood and urine tests to check your kidney function.  Imaging tests of your kidneys, such as: ? A test that involves using sound waves to create an image of your kidneys and the blood flow to your kidneys (ultrasound). ? A test in which dye is injected into one of your blood vessels so images can be taken as  the dye flows through your renal arteries (angiogram). These tests can be done using X-rays, a CT scan (computed tomography angiogram, CTA), or a type of MRI (magnetic resonance angiogram, MRA).  How is this treated? Making lifestyle changes to reduce your risk factors is the first treatment option for early RAS. If the blood flow to one of your kidneys is cut by more than half, you may need medicine to:  Lower your blood pressure. This is the main medical treatment for RAS. You may need more than one type of medicine for this. The two types that work best for RAS are: ? ACE inhibitors. ? Angiotensin receptor blockers.  Reduce fluid in the body (diuretics).  Lower your cholesterol (statins).  If medicine is not enough to control RAS, you may need surgery. This may involve:  Threading a tube with an inflatable balloon into the renal artery to force it open (angioplasty).  Removing plaque from  inside the artery (endarterectomy).  Follow these instructions at home:  Take medicines only as directed by your health care provider.  Make any lifestyle changes recommended by your health care provider. This may include: ? Working with a dietitian to maintain a heart-healthy diet. This type of diet is low in saturated fat, salt, and added sugar. ? Starting an exercise program as directed by your health care provider. ? Maintaining a healthy weight. ? Quitting smoking. ? Not abusing alcohol.  Keep all follow-up visits as directed by your health care provider. This is important. Contact a health care provider if:  Your symptoms of RAS are not getting better.  Your symptoms are changing or getting worse. Get help right away if:  You have very bad pain in your back or abdomen.  You have blood in your urine. This information is not intended to replace advice given to you by your health care provider. Make sure you discuss any questions you have with your health care provider. Document Released: 09/23/2004 Document Revised: 06/05/2015 Document Reviewed: 04/12/2013 Elsevier Interactive Patient Education  2018 [employer]  IF you received an x-ray today, you will receive an invoice from [facility]. Please contact [facility] at [phone] with questions or concerns regarding your invoice.   IF you received labwork today, you will receive an invoice from [facility]. Please contact [facility] at [phone] with questions or concerns regarding your invoice.   Our billing staff will not be able to assist you with questions regarding bills from these companies.  You will be contacted with the lab results as soon as they are available. The fastest way to get your results is to activate your My Chart account. Instructions are located on the last page of this paperwork. If you have not heard from us regarding the results in 2 weeks, please contact this office.

## 2017-12-22 ENCOUNTER — Other Ambulatory Visit: Payer: Self-pay

## 2017-12-28 ENCOUNTER — Ambulatory Visit
Admission: RE | Admit: 2017-12-28 | Discharge: 2017-12-28 | Disposition: A | Discharge status: Home / Self Care | Payer: No Typology Code available for payment source | Source: Ambulatory Visit | Attending: Physician Assistant | Admitting: Physician Assistant

## 2017-12-28 DIAGNOSIS — I10 Essential (primary) hypertension: Secondary | ICD-10-CM

## 2018-01-17 ENCOUNTER — Telehealth: Payer: Self-pay | Admitting: Family Medicine

## 2018-01-17 NOTE — Telephone Encounter (Signed)
MyChart message sent to pt about their appointment on 02/03/18 with Dr Shaw °

## 2018-01-21 ENCOUNTER — Ambulatory Visit: Payer: No Typology Code available for payment source | Admitting: Physician Assistant

## 2018-01-30 ENCOUNTER — Telehealth: Payer: Self-pay | Admitting: Family Medicine

## 2018-01-30 NOTE — Telephone Encounter (Signed)
MyChart message sent to pt about their appointment on 02/13/18 with Dr Shaw. °

## 2018-02-03 ENCOUNTER — Ambulatory Visit: Payer: No Typology Code available for payment source | Admitting: Family Medicine

## 2018-02-13 ENCOUNTER — Ambulatory Visit: Payer: No Typology Code available for payment source | Admitting: Family Medicine

## 2018-03-08 ENCOUNTER — Ambulatory Visit (INDEPENDENT_AMBULATORY_CARE_PROVIDER_SITE_OTHER): Payer: No Typology Code available for payment source | Admitting: Family Medicine

## 2018-03-08 ENCOUNTER — Other Ambulatory Visit: Payer: Self-pay

## 2018-03-08 ENCOUNTER — Encounter: Payer: Self-pay | Admitting: Family Medicine

## 2018-03-08 VITALS — BP 141/94 | HR 78 | Temp 98.7°F | Resp 16 | Ht 67.0 in | Wt 180.2 lb

## 2018-03-08 DIAGNOSIS — G43909 Migraine, unspecified, not intractable, without status migrainosus: Secondary | ICD-10-CM | POA: Diagnosis not present

## 2018-03-08 DIAGNOSIS — Z3009 Encounter for other general counseling and advice on contraception: Secondary | ICD-10-CM | POA: Diagnosis not present

## 2018-03-08 DIAGNOSIS — F410 Panic disorder [episodic paroxysmal anxiety] without agoraphobia: Secondary | ICD-10-CM | POA: Diagnosis not present

## 2018-03-08 DIAGNOSIS — I10 Essential (primary) hypertension: Secondary | ICD-10-CM

## 2018-03-08 DIAGNOSIS — D751 Secondary polycythemia: Secondary | ICD-10-CM

## 2018-03-08 DIAGNOSIS — E785 Hyperlipidemia, unspecified: Secondary | ICD-10-CM | POA: Insufficient documentation

## 2018-03-08 DIAGNOSIS — R7989 Other specified abnormal findings of blood chemistry: Secondary | ICD-10-CM

## 2018-03-08 DIAGNOSIS — R945 Abnormal results of liver function studies: Secondary | ICD-10-CM

## 2018-03-08 DIAGNOSIS — F430 Acute stress reaction: Secondary | ICD-10-CM

## 2018-03-08 DIAGNOSIS — E663 Overweight: Secondary | ICD-10-CM

## 2018-03-08 MED ORDER — ALPRAZOLAM 0.25 MG PO TABS: 0.2500 mg | ORAL_TABLET | Freq: Two times a day (BID) | ORAL | 0 refills | Status: DC | PRN

## 2018-03-08 MED ORDER — AMLODIPINE BESYLATE 5 MG PO TABS: 5.0000 mg | ORAL_TABLET | Freq: Every day | ORAL | 0 refills | Status: DC

## 2018-03-08 NOTE — Progress Notes (Signed)
Subjective:    Patient: Michele Cunningham  DOB: [birth year]; 33 y.o.   MRN: [id number]  Chief Complaint  Patient presents with  . New pt    former [facility] pt  . Hypertension    f/u, per pt last couple of times home BP readings 132/92    HPI  Work has been stressful so has been up 132/92 But when not stressed high 120s/80s. No cough, edema, no urinary issues from BP med.  Is married and sexually active. Not using birth control - not trying but "whatever happens, happens."  Has always been a little high-strung. But occ gets to the level once or twice a month where she has to call her husband to calm her down and she gets on the verge of a panic attack - "freaking out a little"    Medical History No past medical history on file. No past surgical history on file. Current Outpatient Medications on File Prior to Visit  Medication Sig Dispense Refill  . lisinopril-hydrochlorothiazide (PRINZIDE,ZESTORETIC) 20-25 MG tablet Take 1 tablet by mouth daily. 90 tablet 3  . Multiple Vitamins-Minerals (HAIR/SKIN/NAILS/BIOTIN PO) Take by mouth.     No current facility-administered medications on file prior to visit.    No Known Allergies Family History  Problem Relation Age of Onset  . Cancer Mother        skin  . Hypertension Mother    Social History   Socioeconomic History  . Marital status: Married    Spouse name: Not on file  . Number of children: Not on file  . Years of education: Not on file  . Highest education level: Not on file  Occupational History  . Not on file  Social Needs  . Financial resource strain: Not on file  . Food insecurity:    Worry: Not on file    Inability: Not on file  . Transportation needs:    Medical: Not on file    Non-medical: Not on file  Tobacco Use  . Smoking status: Never Smoker  . Smokeless tobacco: Never Used  Substance and Sexual Activity  . Alcohol use: Not on file    Comment: 3 - 4 days a week drinks   . Drug use: Not on file  . Sexual  activity: Yes    Partners: Male    Comment: 1st intercourse-, partners- 80, married- 8 yrs   Lifestyle  . Physical activity:    Days per week: Not on file    Minutes per session: Not on file  . Stress: Not on file  Relationships  . Social connections:    Talks on phone: Not on file    Gets together: Not on file    Attends religious service: Not on file    Active member of club or organization: Not on file    Attends meetings of clubs or organizations: Not on file    Relationship status: Not on file  Other Topics Concern  . Not on file  Social History Narrative  . Not on file   Depression screen [facility] 2/9 03/08/2018 12/14/2017 11/16/2017 10/27/2017  Decreased Interest 0 0 0 0  Down, Depressed, Hopeless 0 0 0 0  PHQ - 2 Score 0 0 0 0    ROS As noted in HPI  Objective:  BP (!) 141/94 (BP Location: Right Arm, Patient Position: Sitting, Cuff Size: Normal)   Pulse 78   Temp 98.7 F (37.1 C) (Oral)   Resp 16   Ht 5'  7" (1.702 m)   Wt 180 lb 3.2 oz (81.7 kg)   LMP 02/23/2018   SpO2 96%   BMI 28.22 kg/m  Physical Exam Constitutional:      General: She is not in acute distress.    Appearance: She is well-developed. She is not diaphoretic.  HENT:     Head: Normocephalic and atraumatic.     Right Ear: External ear normal.     Left Ear: External ear normal.  Eyes:     General: No scleral icterus.    Conjunctiva/sclera: Conjunctivae normal.  Neck:     Musculoskeletal: Normal range of motion and neck supple.     Thyroid: No thyromegaly.  Cardiovascular:     Rate and Rhythm: Normal rate and regular rhythm.     Heart sounds: Normal heart sounds.  Pulmonary:     Effort: Pulmonary effort is normal. No respiratory distress.     Breath sounds: Normal breath sounds.  Lymphadenopathy:     Cervical: No cervical adenopathy.  Skin:    General: Skin is warm and dry.     Findings: No erythema.  Neurological:     Mental Status: She is alert and oriented to person, place, and time.    Psychiatric:        Behavior: Behavior normal.     POC TESTING No visits with results within 3 Day(s) from this visit.  Latest known visit with results is:  Office Visit on 10/27/2017  Component Date Value Ref Range Status  . Cholesterol, Total 10/27/2017 219* 100 - 199 mg/dL Final  . Triglycerides 10/27/2017 317* 0 - 149 mg/dL Final  . HDL 37/34/2876 56  >39 mg/dL Final  . VLDL Cholesterol Cal 10/27/2017 63* 5 - 40 mg/dL Final  . LDL Calculated 10/27/2017 811* 0 - 99 mg/dL Final  . Chol/HDL Ratio 10/27/2017 3.9  0.0 - 4.4 ratio Final   Comment:                                   T. Chol/HDL Ratio                                             Men  Women                               1/2 Avg.Risk  3.4    3.3                                   Avg.Risk  5.0    4.4                                2X Avg.Risk  9.6    7.1                                3X Avg.Risk 23.4   11.0   . WBC 10/27/2017 8.7  4.6 - 10.2 K/uL Final  . Lymph, poc 10/27/2017 1.9  0.6 - 3.4 Final  . POC LYMPH PERCENT 10/27/2017 22.4  10 - 50 %L  Final  . MID (cbc) 10/27/2017 0.7  0 - 0.9 Final  . POC MID % 10/27/2017 8.2  0 - 12 %M Final  . POC Granulocyte 10/27/2017 6.0  2 - 6.9 Final  . Granulocyte percent 10/27/2017 69.4  37 - 80 %G Final  . RBC 10/27/2017 4.85  4.04 - 5.48 M/uL Final  . Hemoglobin 10/27/2017 14.1* 9.5 - 13.5 g/dL Final  . HCT, POC 69/62/9528 42.3* 29 - 41 % Final  . MCV 10/27/2017 87.1  76 - 111 fL Final  . MCH, POC 10/27/2017 29.0  27 - 31.2 pg Final  . MCHC 10/27/2017 33.3  31.8 - 35.4 g/dL Final  . RDW, POC 41/32/4401 13.4  % Final  . Platelet Count, POC 10/27/2017 396  142 - 424 K/uL Final  . MPV 10/27/2017 7.3  0 - 99.8 fL Final  . Hemoglobin A1C 10/27/2017 5.2  4.0 - 5.6 % Final  . Color, UA 10/27/2017 yellow  yellow Final  . Clarity, UA 10/27/2017 clear  clear Final  . Glucose, UA 10/27/2017 negative  negative mg/dL Final  . Bilirubin, UA 10/27/2017 negative  negative Final  .  Ketones, POC UA 10/27/2017 negative  negative mg/dL Final  . Spec Grav, UA 10/27/2017 1.010  1.010 - 1.025 Final  . Blood, UA 10/27/2017 trace-lysed* negative Final  . pH, UA 10/27/2017 6.5  5.0 - 8.0 Final  . Protein Ur, POC 10/27/2017 negative  negative mg/dL Final  . Urobilinogen, UA 10/27/2017 0.2  0.2 or 1.0 E.U./dL Final  . Nitrite, UA 02/72/5366 Negative  Negative Final  . Leukocytes, UA 10/27/2017 Negative  Negative Final  . TSH 10/27/2017 1.530  0.450 - 4.500 uIU/mL Final  . Glucose 10/27/2017 88  65 - 99 mg/dL Final  . BUN 44/03/4740 11  6 - 20 mg/dL Final  . Creatinine, Ser 10/27/2017 0.77  0.57 - 1.00 mg/dL Final  . GFR calc non Af Amer 10/27/2017 101  >59 mL/min/1.73 Final  . GFR calc Af Amer 10/27/2017 117  >59 mL/min/1.73 Final  . BUN/Creatinine Ratio 10/27/2017 14  9 - 23 Final  . Sodium 10/27/2017 139  134 - 144 mmol/L Final  . Potassium 10/27/2017 4.5  3.5 - 5.2 mmol/L Final  . Chloride 10/27/2017 99  96 - 106 mmol/L Final  . CO2 10/27/2017 22  20 - 29 mmol/L Final  . Calcium 10/27/2017 11.2* 8.7 - 10.2 mg/dL Final  . Total Protein 10/27/2017 7.9  6.0 - 8.5 g/dL Final  . Albumin 59/56/3875 5.1  3.5 - 5.5 g/dL Final  . Globulin, Total 10/27/2017 2.8  1.5 - 4.5 g/dL Final  . Albumin/Globulin Ratio 10/27/2017 1.8  1.2 - 2.2 Final  . Bilirubin Total 10/27/2017 0.3  0.0 - 1.2 mg/dL Final  . Alkaline Phosphatase 10/27/2017 54  39 - 117 IU/L Final  . AST 10/27/2017 48* 0 - 40 IU/L Final  . ALT 10/27/2017 35* 0 - 32 IU/L Final     Assessment & Plan:   1. Essential hypertension   2. Migraine without status migrainosus, not intractable, unspecified migraine type   3. Panic attack as reaction to stress   4. Family planning advice   5. Hyperlipidemia, unspecified hyperlipidemia type   6. Elevated liver function tests   7. Hypercalcemia   8. Polycythemia   9. Overweight (BMI 25.0-29.9)    bp not ideally controlled on lisinopril-hctz 20-25 and  lisinopril is teratogenic  medication - and as pt is sexually active with husband and not  using birth control - would be ok w/ preg if it happened - so d/c and try amlodipine 5 instead - expect may need to titrate up to 10 so call in several wks w/ BP readings. May also need to add back in diuretic  Needs recheck cbc, cmp, lipids at next OV - mild LFT elevation due to elev triglycerides.  Calcium was sig elevated, hgb was elevated. NEEDS TO BE TOLD TO FAST AT NEXT OV. NEEDS TO BE TOLD TO START PN MVI AT NEXT OV. Patient will continue on current chronic medications other than changes noted above, so ok to refill when needed.   See after visit summary for patient specific instructions.   Meds ordered this encounter  Medications  . ALPRAZolam (XANAX) 0.25 MG tablet    Sig: Take 1 tablet (0.25 mg total) by mouth 2 (two) times daily as needed for anxiety.    Dispense:  20 tablet    Refill:  0  . amLODipine (NORVASC) 5 MG tablet    Sig: Take 1 tablet (5 mg total) by mouth daily.    Dispense:  30 tablet    Refill:  0    Patient verbalized to me that they understand the following: diagnosis, what is being done for them, what to expect and what should be done at home.  Their questions have been answered. They understand that I am unable to predict every possible medication interaction or adverse outcome and that if any unexpected symptoms arise, they should contact us and their pharmacist, as well as never hesitate to seek urgent/emergent care at [facility] or ER if they think it might be warranted.    Norberto Sorenson, MD, MPH Primary Care at [facility] [address] [place], [place]  [address] [phone] Office phone  [phone] Office fax  03/08/18 2:05 PM

## 2018-03-08 NOTE — Patient Instructions (Addendum)
   If you have lab work done today you will be contacted with your lab results within the next 2 weeks.  If you have not heard from us then please contact us. The fastest way to get your results is to register for My Chart.   IF you received an x-ray today, you will receive an invoice from [facility]. Please contact [facility] at [phone] with questions or concerns regarding your invoice.   IF you received labwork today, you will receive an invoice from [facility]. Please contact [facility] at [phone] with questions or concerns regarding your invoice.   Our billing staff will not be able to assist you with questions regarding bills from these companies.  You will be contacted with the lab results as soon as they are available. The fastest way to get your results is to activate your My Chart account. Instructions are located on the last page of this paperwork. If you have not heard from us regarding the results in 2 weeks, please contact this office.     Managing Your Hypertension Hypertension is commonly called high blood pressure. This is when the force of your blood pressing against the walls of your arteries is too strong. Arteries are blood vessels that carry blood from your heart throughout your body. Hypertension forces the heart to work harder to pump blood, and may cause the arteries to become narrow or stiff. Having untreated or uncontrolled hypertension can cause heart attack, stroke, kidney disease, and other problems. What are blood pressure readings? A blood pressure reading consists of a higher number over a lower number. Ideally, your blood pressure should be below 120/80. The first ("top") number is called the systolic pressure. It is a measure of the pressure in your arteries as your heart beats. The second ("bottom") number is called the diastolic pressure. It is a measure of the pressure in your arteries as the heart relaxes. What does my blood  pressure reading mean? Blood pressure is classified into four stages. Based on your blood pressure reading, your health care provider may use the following stages to determine what type of treatment you need, if any. Systolic pressure and diastolic pressure are measured in a unit called mm Hg. Normal  Systolic pressure: below 120.  Diastolic pressure: below 80. Elevated  Systolic pressure: 120-129.  Diastolic pressure: below 80. Hypertension stage 1  Systolic pressure: 130-139.  Diastolic pressure: 80-89. Hypertension stage 2  Systolic pressure: 140 or above.  Diastolic pressure: 90 or above. What health risks are associated with hypertension? Managing your hypertension is an important responsibility. Uncontrolled hypertension can lead to:  A heart attack.  A stroke.  A weakened blood vessel (aneurysm).  Heart failure.  Kidney damage.  Eye damage.  Metabolic syndrome.  Memory and concentration problems. What changes can I make to manage my hypertension? Hypertension can be managed by making lifestyle changes and possibly by taking medicines. Your health care provider will help you make a plan to bring your blood pressure within a normal range. Eating and drinking   Eat a diet that is high in fiber and potassium, and low in salt (sodium), added sugar, and fat. An example eating plan is called the DASH (Dietary Approaches to Stop Hypertension) diet. To eat this way: ? Eat plenty of fresh fruits and vegetables. Try to fill half of your plate at each meal with fruits and vegetables. ? Eat whole grains, such as whole wheat pasta, brown rice, or whole grain bread. Fill   about one quarter of your plate with whole grains. ? Eat low-fat diary products. ? Avoid fatty cuts of meat, processed or cured meats, and poultry with skin. Fill about one quarter of your plate with lean proteins such as fish, chicken without skin, beans, eggs, and tofu. ? Avoid premade and processed foods.  These tend to be higher in sodium, added sugar, and fat.  Reduce your daily sodium intake. Most people with hypertension should eat less than 1,500 mg of sodium a day.  Limit alcohol intake to no more than 1 drink a day for nonpregnant women and 2 drinks a day for men. One drink equals 12 oz of beer, 5 oz of wine, or 1 oz of hard liquor. Lifestyle  Work with your health care provider to maintain a healthy body weight, or to lose weight. Ask what an ideal weight is for you.  Get at least 30 minutes of exercise that causes your heart to beat faster (aerobic exercise) most days of the week. Activities may include walking, swimming, or biking.  Include exercise to strengthen your muscles (resistance exercise), such as weight lifting, as part of your weekly exercise routine. Try to do these types of exercises for 30 minutes at least 3 days a week.  Do not use any products that contain nicotine or tobacco, such as cigarettes and e-cigarettes. If you need help quitting, ask your health care provider.  Control any long-term (chronic) conditions you have, such as high cholesterol or diabetes. Monitoring  Monitor your blood pressure at home as told by your health care provider. Your personal target blood pressure may vary depending on your medical conditions, your age, and other factors.  Have your blood pressure checked regularly, as often as told by your health care provider. Working with your health care provider  Review all the medicines you take with your health care provider because there may be side effects or interactions.  Talk with your health care provider about your diet, exercise habits, and other lifestyle factors that may be contributing to hypertension.  Visit your health care provider regularly. Your health care provider can help you create and adjust your plan for managing hypertension. Will I need medicine to control my blood pressure? Your health care provider may prescribe  medicine if lifestyle changes are not enough to get your blood pressure under control, and if:  Your systolic blood pressure is 130 or higher.  Your diastolic blood pressure is 80 or higher. Take medicines only as told by your health care provider. Follow the directions carefully. Blood pressure medicines must be taken as prescribed. The medicine does not work as well when you skip doses. Skipping doses also puts you at risk for problems. Contact a health care provider if:  You think you are having a reaction to medicines you have taken.  You have repeated (recurrent) headaches.  You feel dizzy.  You have swelling in your ankles.  You have trouble with your vision. Get help right away if:  You develop a severe headache or confusion.  You have unusual weakness or numbness, or you feel faint.  You have severe pain in your chest or abdomen.  You vomit repeatedly.  You have trouble breathing. Summary  Hypertension is when the force of blood pumping through your arteries is too strong. If this condition is not controlled, it may put you at risk for serious complications.  Your personal target blood pressure may vary depending on your medical conditions, your age, and other   factors. For most people, a normal blood pressure is less than 120/80.  Hypertension is managed by lifestyle changes, medicines, or both. Lifestyle changes include weight loss, eating a healthy, low-sodium diet, exercising more, and limiting alcohol. This information is not intended to replace advice given to you by your health care provider. Make sure you discuss any questions you have with your health care provider. Document Released: 09/22/2011 Document Revised: 11/26/2015 Document Reviewed: 11/26/2015 Elsevier Interactive Patient Education  2019 [employer].

## 2018-03-13 ENCOUNTER — Telehealth: Payer: Self-pay | Admitting: Family Medicine

## 2018-03-13 NOTE — Telephone Encounter (Signed)
mychart message sent to pt about their appointment with Dr Shaw °

## 2018-03-29 ENCOUNTER — Other Ambulatory Visit: Payer: Self-pay | Admitting: General Practice

## 2018-03-29 ENCOUNTER — Telehealth: Payer: Self-pay | Admitting: General Practice

## 2018-03-29 MED ORDER — AMLODIPINE BESYLATE 5 MG PO TABS: 5.0000 mg | ORAL_TABLET | Freq: Every day | ORAL | 0 refills | Status: DC

## 2018-03-29 NOTE — Telephone Encounter (Signed)
Requested Prescriptions  Pending Prescriptions Disp Refills  . amLODipine (NORVASC) 5 MG tablet 90 tablet 0    Sig: Take 1 tablet (5 mg total) by mouth daily.     Cardiovascular:  Calcium Channel Blockers Failed - 03/29/2018  9:14 AM      Failed - Last BP in normal range    BP Readings from Last 1 Encounters:  03/08/18 (!) 141/94         Passed - Valid encounter within last 6 months    Recent Outpatient Visits          3 weeks ago Essential hypertension   [facility] at [facility], Levell July, MD   3 months ago Essential hypertension   Primary Care at [facility], Madelaine Bhat, PA-C   4 months ago Essential hypertension   Primary Care at [facility], Madelaine Bhat, PA-C   5 months ago Essential hypertension   Primary Care at [employer] [facility], Madelaine Bhat, PA-C      Future Appointments            In 1 month Leretha Pol, Meda Coffee, MD Primary Care at [place], [facility]

## 2018-03-29 NOTE — Telephone Encounter (Signed)
Copied from CRM [id number]. Topic: Quick Communication - Rx Refill/Question >> Mar 29, 2018  8:58 AM Laural Benes, [place] C wrote: Medication: amLODipine (NORVASC) 5 MG tablet   Has the patient contacted their pharmacy? No   (Agent: If no, request that the patient contact the pharmacy for the refill.) (Agent: If yes, when and what did the pharmacy advise?)  Preferred Pharmacy (with phone number or street name): [id number] [place], [place] - [phone] AT [facility] [address] [phone] (Phone) [phone] (Fax)   Pt is schedule to TOC to [place] at next available. (May 8)   Agent: Please be advised that RX refills may take up to 3 business days. We ask that you follow-up with your pharmacy.

## 2018-03-29 NOTE — Telephone Encounter (Signed)
Copied from CRM [id number]. Topic: Quick Communication - Rx Refill/Question >> Mar 29, 2018  8:58 AM Laural Benes, [place] C wrote: Medication: amLODipine (NORVASC) 5 MG tablet   Has the patient contacted their pharmacy? No   (Agent: If no, request that the patient contact the pharmacy for the refill.) (Agent: If yes, when and what did the pharmacy advise?)  Preferred Pharmacy (with phone number or street name): [id number] [place], [place] - [phone] AT [facility] [address] & [address] [place] [phone] (Phone) [phone] (Fax)    Agent: Please be advised that RX refills may take up to 3 business days. We ask that you follow-up with your pharmacy.

## 2018-03-30 ENCOUNTER — Other Ambulatory Visit: Payer: Self-pay

## 2018-03-31 ENCOUNTER — Ambulatory Visit (INDEPENDENT_AMBULATORY_CARE_PROVIDER_SITE_OTHER): Payer: No Typology Code available for payment source | Admitting: Obstetrics & Gynecology

## 2018-03-31 ENCOUNTER — Encounter: Payer: Self-pay | Admitting: Obstetrics & Gynecology

## 2018-03-31 VITALS — BP 144/88 | Ht 66.5 in | Wt 182.0 lb

## 2018-03-31 DIAGNOSIS — E663 Overweight: Secondary | ICD-10-CM | POA: Diagnosis not present

## 2018-03-31 DIAGNOSIS — I10 Essential (primary) hypertension: Secondary | ICD-10-CM | POA: Diagnosis not present

## 2018-03-31 DIAGNOSIS — Z01419 Encounter for gynecological examination (general) (routine) without abnormal findings: Secondary | ICD-10-CM | POA: Diagnosis not present

## 2018-03-31 DIAGNOSIS — Z3009 Encounter for other general counseling and advice on contraception: Secondary | ICD-10-CM | POA: Diagnosis not present

## 2018-03-31 NOTE — Progress Notes (Signed)
Malke Fanti Tritschler Jan 24, 1985 [id number]   History:    33 y.o. G0 Married x 3 yrs  RP:  Established patient presenting for annual gyn exam   HPI: Normal menstrual periods every month with normal flow.  No breakthrough bleeding.  No pelvic pain.  Normal vaginal secretions.  No pain with intercourse.  No contraception, ok if conceives. Urine and bowel movements normal.  Breast normal.  Body mass index 28.94.  Not exercising regularly.  Has a family physician treating her chronic hypertension with Norvasc 5 mg daily.  Per patient blood pressure numbers are all in the 140s over upper 80s or above at home.  Low-salt diet.  Past medical history,surgical history, family history and social history were all reviewed and documented in the EPIC chart.  Gynecologic History Patient's last menstrual period was 03/24/2018. Contraception: none Last Pap: 03/2017.  Results were: Negative/HPV HR neg Last mammogram: Never Bone Density: Never Colonoscopy: Never  Obstetric History OB History  Gravida Para Term Preterm AB Living  0 0 0 0 0 0  SAB TAB Ectopic Multiple Live Births  0 0 0 0 0     ROS: A ROS was performed and pertinent positives and negatives are included in the history.  GENERAL: No fevers or chills. HEENT: No change in vision, no earache, sore throat or sinus congestion. NECK: No pain or stiffness. CARDIOVASCULAR: No chest pain or pressure. No palpitations. PULMONARY: No shortness of breath, cough or wheeze. GASTROINTESTINAL: No abdominal pain, nausea, vomiting or diarrhea, melena or bright red blood per rectum. GENITOURINARY: No urinary frequency, urgency, hesitancy or dysuria. MUSCULOSKELETAL: No joint or muscle pain, no back pain, no recent trauma. DERMATOLOGIC: No rash, no itching, no lesions. ENDOCRINE: No polyuria, polydipsia, no heat or cold intolerance. No recent change in weight. HEMATOLOGICAL: No anemia or easy bruising or bleeding. NEUROLOGIC: No headache, seizures, numbness, tingling or  weakness. PSYCHIATRIC: No depression, no loss of interest in normal activity or change in sleep pattern.     Exam:   BP (!) 144/88   Ht 5' 6.5" (1.689 m)   Wt 182 lb (82.6 kg)   LMP 03/24/2018   BMI 28.94 kg/m   Body mass index is 28.94 kg/m.  General appearance : Well developed well nourished female. No acute distress HEENT: Eyes: no retinal hemorrhage or exudates,  Neck supple, trachea midline, no carotid bruits, no thyroidmegaly Lungs: Clear to auscultation, no rhonchi or wheezes, or rib retractions  Heart: Regular rate and rhythm, no murmurs or gallops Breast:Examined in sitting and supine position were symmetrical in appearance, no palpable masses or tenderness,  no skin retraction, no nipple inversion, no nipple discharge, no skin discoloration, no axillary or supraclavicular lymphadenopathy Abdomen: no palpable masses or tenderness, no rebound or guarding Extremities: no edema or skin discoloration or tenderness  Pelvic: Vulva: Normal             Vagina: No gross lesions or discharge  Cervix: No gross lesions or discharge.  Pap reflex done.  Uterus  AV, normal size, shape and consistency, non-tender and mobile  Adnexa  Without masses or tenderness  Anus: Normal   Assessment/Plan:  33 y.o. female for annual exam   1. Encounter for routine gynecological examination with Papanicolaou smear of cervix Normal gynecologic exam.  Pap reflex done.  Breast exam normal.  2. Encounter for other general counseling or advice on contraception Declines contraception, ok if conceives.  3. Essential hypertension Will call her Fam MD at [facility] to  adjust her cHTN treatment as her BPs at home and here are all in the 140's/upper 80's or higher. If not able to reach her Fam MD, recommend to increase Norvasc to 10 mg PO daily and continue to monitor at home.  Low salt diet recommended.  4. Overweight (BMI 25.0-29.9) Recommend lower calorie/carb diet such as [employer].  Physical  activities with aerobic activities 5 times a week and weightlifting every 2 days.  Genia Del MD, 2:00 PM 03/31/2018

## 2018-03-31 NOTE — Patient Instructions (Signed)
1. Encounter for routine gynecological examination with Papanicolaou smear of cervix Normal gynecologic exam.  Pap reflex done.  Breast exam normal.  2. Encounter for other general counseling or advice on contraception Declines contraception, ok if conceives.  3. Essential hypertension Will call her Fam MD at [facility] to adjust her cHTN treatment as her BPs at home and here are all in the 140's/upper 80's or higher. If not able to reach her Fam MD, recommend to increase Norvasc to 10 mg PO daily and continue to monitor at home.  Low salt diet recommended.  4. Overweight (BMI 25.0-29.9) Recommend lower calorie/carb diet such as [employer].  Physical activities with aerobic activities 5 times a week and weightlifting every 2 days.  Akeira, it was a pleasure seeing you today!  I will inform you of your results as soon as they are available.

## 2018-04-03 LAB — PAP IG W/ RFLX HPV ASCU

## 2018-04-04 ENCOUNTER — Encounter: Payer: Self-pay | Admitting: *Deleted

## 2018-04-05 ENCOUNTER — Ambulatory Visit: Payer: No Typology Code available for payment source | Admitting: Family Medicine

## 2018-05-19 ENCOUNTER — Encounter: Payer: Self-pay | Admitting: Family Medicine

## 2018-05-19 ENCOUNTER — Other Ambulatory Visit: Payer: Self-pay

## 2018-05-19 ENCOUNTER — Ambulatory Visit (INDEPENDENT_AMBULATORY_CARE_PROVIDER_SITE_OTHER): Payer: No Typology Code available for payment source | Admitting: Family Medicine

## 2018-05-19 VITALS — BP 138/90 | HR 94 | Temp 99.0°F | Ht 66.5 in | Wt 187.4 lb

## 2018-05-19 DIAGNOSIS — I10 Essential (primary) hypertension: Secondary | ICD-10-CM | POA: Diagnosis not present

## 2018-05-19 DIAGNOSIS — R945 Abnormal results of liver function studies: Secondary | ICD-10-CM | POA: Diagnosis not present

## 2018-05-19 DIAGNOSIS — E785 Hyperlipidemia, unspecified: Secondary | ICD-10-CM

## 2018-05-19 DIAGNOSIS — R7989 Other specified abnormal findings of blood chemistry: Secondary | ICD-10-CM

## 2018-05-19 MED ORDER — AMLODIPINE BESYLATE 5 MG PO TABS: 5.0000 mg | ORAL_TABLET | Freq: Every day | ORAL | 1 refills | Status: DC

## 2018-05-19 NOTE — Patient Instructions (Addendum)
   If you have lab work done today you will be contacted with your lab results within the next 2 weeks.  If you have not heard from us then please contact us. The fastest way to get your results is to register for My Chart.   IF you received an x-ray today, you will receive an invoice from [facility]. Please contact [facility] at [phone] with questions or concerns regarding your invoice.   IF you received labwork today, you will receive an invoice from [facility]. Please contact [facility] at [phone] with questions or concerns regarding your invoice.   Our billing staff will not be able to assist you with questions regarding bills from these companies.  You will be contacted with the lab results as soon as they are available. The fastest way to get your results is to activate your My Chart account. Instructions are located on the last page of this paperwork. If you have not heard from us regarding the results in 2 weeks, please contact this office.     Managing Your Hypertension Hypertension is commonly called high blood pressure. This is when the force of your blood pressing against the walls of your arteries is too strong. Arteries are blood vessels that carry blood from your heart throughout your body. Hypertension forces the heart to work harder to pump blood, and may cause the arteries to become narrow or stiff. Having untreated or uncontrolled hypertension can cause heart attack, stroke, kidney disease, and other problems. What are blood pressure readings? A blood pressure reading consists of a higher number over a lower number. Ideally, your blood pressure should be below 120/80. The first ("top") number is called the systolic pressure. It is a measure of the pressure in your arteries as your heart beats. The second ("bottom") number is called the diastolic pressure. It is a measure of the pressure in your arteries as the heart relaxes. What does my blood  pressure reading mean? Blood pressure is classified into four stages. Based on your blood pressure reading, your health care provider may use the following stages to determine what type of treatment you need, if any. Systolic pressure and diastolic pressure are measured in a unit called mm Hg. Normal  Systolic pressure: below 120.  Diastolic pressure: below 80. Elevated  Systolic pressure: 120-129.  Diastolic pressure: below 80. Hypertension stage 1  Systolic pressure: 130-139.  Diastolic pressure: 80-89. Hypertension stage 2  Systolic pressure: 140 or above.  Diastolic pressure: 90 or above. What health risks are associated with hypertension? Managing your hypertension is an important responsibility. Uncontrolled hypertension can lead to:  A heart attack.  A stroke.  A weakened blood vessel (aneurysm).  Heart failure.  Kidney damage.  Eye damage.  Metabolic syndrome.  Memory and concentration problems. What changes can I make to manage my hypertension? Hypertension can be managed by making lifestyle changes and possibly by taking medicines. Your health care provider will help you make a plan to bring your blood pressure within a normal range. Eating and drinking   Eat a diet that is high in fiber and potassium, and low in salt (sodium), added sugar, and fat. An example eating plan is called the DASH (Dietary Approaches to Stop Hypertension) diet. To eat this way: ? Eat plenty of fresh fruits and vegetables. Try to fill half of your plate at each meal with fruits and vegetables. ? Eat whole grains, such as whole wheat pasta, brown rice, or whole grain bread. Fill   about one quarter of your plate with whole grains. ? Eat low-fat diary products. ? Avoid fatty cuts of meat, processed or cured meats, and poultry with skin. Fill about one quarter of your plate with lean proteins such as fish, chicken without skin, beans, eggs, and tofu. ? Avoid premade and processed foods.  These tend to be higher in sodium, added sugar, and fat.  Reduce your daily sodium intake. Most people with hypertension should eat less than 1,500 mg of sodium a day.  Limit alcohol intake to no more than 1 drink a day for nonpregnant women and 2 drinks a day for men. One drink equals 12 oz of beer, 5 oz of wine, or 1 oz of hard liquor. Lifestyle  Work with your health care provider to maintain a healthy body weight, or to lose weight. Ask what an ideal weight is for you.  Get at least 30 minutes of exercise that causes your heart to beat faster (aerobic exercise) most days of the week. Activities may include walking, swimming, or biking.  Include exercise to strengthen your muscles (resistance exercise), such as weight lifting, as part of your weekly exercise routine. Try to do these types of exercises for 30 minutes at least 3 days a week.  Do not use any products that contain nicotine or tobacco, such as cigarettes and e-cigarettes. If you need help quitting, ask your health care provider.  Control any long-term (chronic) conditions you have, such as high cholesterol or diabetes. Monitoring  Monitor your blood pressure at home as told by your health care provider. Your personal target blood pressure may vary depending on your medical conditions, your age, and other factors.  Have your blood pressure checked regularly, as often as told by your health care provider. Working with your health care provider  Review all the medicines you take with your health care provider because there may be side effects or interactions.  Talk with your health care provider about your diet, exercise habits, and other lifestyle factors that may be contributing to hypertension.  Visit your health care provider regularly. Your health care provider can help you create and adjust your plan for managing hypertension. Will I need medicine to control my blood pressure? Your health care provider may prescribe  medicine if lifestyle changes are not enough to get your blood pressure under control, and if:  Your systolic blood pressure is 130 or higher.  Your diastolic blood pressure is 80 or higher. Take medicines only as told by your health care provider. Follow the directions carefully. Blood pressure medicines must be taken as prescribed. The medicine does not work as well when you skip doses. Skipping doses also puts you at risk for problems. Contact a health care provider if:  You think you are having a reaction to medicines you have taken.  You have repeated (recurrent) headaches.  You feel dizzy.  You have swelling in your ankles.  You have trouble with your vision. Get help right away if:  You develop a severe headache or confusion.  You have unusual weakness or numbness, or you feel faint.  You have severe pain in your chest or abdomen.  You vomit repeatedly.  You have trouble breathing. Summary  Hypertension is when the force of blood pumping through your arteries is too strong. If this condition is not controlled, it may put you at risk for serious complications.  Your personal target blood pressure may vary depending on your medical conditions, your age, and other   factors. For most people, a normal blood pressure is less than 120/80.  Hypertension is managed by lifestyle changes, medicines, or both. Lifestyle changes include weight loss, eating a healthy, low-sodium diet, exercising more, and limiting alcohol. This information is not intended to replace advice given to you by your health care provider. Make sure you discuss any questions you have with your health care provider. Document Released: 09/22/2011 Document Revised: 11/26/2015 Document Reviewed: 11/26/2015 Elsevier Interactive Patient Education  2019 [employer].

## 2018-05-19 NOTE — Progress Notes (Signed)
5/8/20203:09 PM  Michele Cunningham 02/27/1985, 33 y.o., female [id number]  Chief Complaint  Patient presents with  . Hypertension    TOC, says bp is always high yet takes blooed pressure meds daily.     HPI:   Patient is a 33 y.o. female with past medical history significant for HTN, panic acttacks who presents today for TOC  Last OV with Dr Brigitte Pulse in Feb 2020 Changed from lisinorpil - hctz to amlodipine as patient not on birth control  Also incidental findings in labs from OCT 2019 -  TG 317, not fasting Ca 11.2 - NOT on HCTZ AST 48/ ALT 35 - etoh??? H/H 14.1/42.3, normal RBC  Patient reports she is overall doing well Tolerating amlodipine well Does not check BP at home very often, not recently  Denies any headaches, vision changes, dizziness, nausea, abd pain, chest pain, sob, edema Denies any diaphoresis, palpitations Does not smoke FHx of HTN - parents and sister Denies FHx CAD, CVA Reports sister has issues with kidney stones Husband has been complaining of her snoring or recent, denies any witnessed apnea or abnormal breathing, wakes up felling rested, denies daytime somnolence  Has been very cautious about NSAID use, denies decongestant use, denies any weight loss supplements Drinks coffee in the morning, no other caffeine during the day Etoh - drinks 1-2 glasses wine a night, denies drinking more than that in the past   Normal renal US with doppler in dec 2019  Anxiety is much better now that work has stabilized  Fall Risk  05/19/2018 03/08/2018 12/14/2017 11/16/2017 10/27/2017  Falls in the past year? 0 0 0 0 No  Number falls in past yr: 0 - - - -  Injury with Fall? 0 - - - -     Depression screen Michele Cunningham 2/9 05/19/2018 03/08/2018 12/14/2017  Decreased Interest 0 0 0  Down, Depressed, Hopeless 0 0 0  PHQ - 2 Score 0 0 0    No Known Allergies  Prior to Admission medications   Medication Sig Start Date End Date Taking? Authorizing Provider  ALPRAZolam (XANAX) 0.25 MG  tablet Take 1 tablet (0.25 mg total) by mouth 2 (two) times daily as needed for anxiety. 03/08/18  Yes Shawnee Knapp, MD  amLODipine (NORVASC) 5 MG tablet Take 1 tablet (5 mg total) by mouth daily. 03/29/18  Yes Rutherford Guys, MD  Multiple Vitamins-Minerals (HAIR/SKIN/NAILS/BIOTIN PO) Take by mouth.   Yes [provider]    History reviewed. No pertinent past medical history.  History reviewed. No pertinent surgical history.  Social History   Tobacco Use  . Smoking status: Never Smoker  . Smokeless tobacco: Never Used  Substance Use Topics  . Alcohol use: Yes    Comment: 3 - 4 days a week drinks     Family History  Problem Relation Age of Onset  . Cancer Mother        skin  . Hypertension Mother     ROS Per hpi  OBJECTIVE:  Today's Vitals   05/19/18 1440 05/19/18 1537  BP: (!) 161/106 138/90  Pulse: 94   Temp: 99 F (37.2 C)   TempSrc: Oral   SpO2: 99%   Weight: 187 lb 6.4 oz (85 kg)   Height: 5' 6.5" (1.689 m)    Body mass index is 29.79 kg/m.   Physical Exam Vitals signs and nursing note reviewed.  Constitutional:      General: She is not in acute distress.  Appearance: She is well-developed.  HENT:     Head: Normocephalic and atraumatic.     Mouth/Throat:     Mouth: Mucous membranes are moist.  Eyes:     General: No scleral icterus.    Conjunctiva/sclera: Conjunctivae normal.     Pupils: Pupils are equal, round, and reactive to light.  Neck:     Musculoskeletal: Neck supple.     Thyroid: No thyroid mass or thyromegaly.  Cardiovascular:     Rate and Rhythm: Normal rate and regular rhythm.     Pulses: Normal pulses.     Heart sounds: Normal heart sounds. No murmur. No friction rub. No gallop.   Pulmonary:     Effort: Pulmonary effort is normal.     Breath sounds: Normal breath sounds. No wheezing, rhonchi or rales.  Skin:    General: Skin is warm and dry.  Neurological:     Mental Status: She is alert and oriented to person, place,  and time.    ASSESSMENT and PLAN  1. Essential hypertension Controlled. Continue current regime. Discussed LFM. - TSH - CMP14+EGFR  2. Hypercalcemia Rechecking, further workup pending results.  - CMP14+EGFR  3. Hyperlipidemia, unspecified hyperlipidemia type Rechecking labs, discussed LFM for elevated TG - Lipid panel - TSH  4. Abnormal LFTs Rechecking labs, discussed avoidance of liver toxins - CMP14+EGFR  Other orders - amLODipine (NORVASC) 5 MG tablet; Take 1 tablet (5 mg total) by mouth daily.  Return in about 3 months (around 08/19/2018).    Rutherford Guys, MD Primary Care at [place], [place] [address] Ph.  [phone] Fax [phone]

## 2018-05-20 LAB — TSH: TSH: 1.61 u[IU]/mL (ref 0.450–4.500)

## 2018-05-20 LAB — CMP14+EGFR
ALT: 31 IU/L (ref 0–32)
AST: 44 IU/L — ABNORMAL HIGH (ref 0–40)
Albumin/Globulin Ratio: 2 (ref 1.2–2.2)
Albumin: 5.2 g/dL — ABNORMAL HIGH (ref 3.8–4.8)
Alkaline Phosphatase: 51 IU/L (ref 39–117)
BUN/Creatinine Ratio: 12 (ref 9–23)
BUN: 10 mg/dL (ref 6–20)
Bilirubin Total: 0.3 mg/dL (ref 0.0–1.2)
CO2: 21 mmol/L (ref 20–29)
Calcium: 10.6 mg/dL — ABNORMAL HIGH (ref 8.7–10.2)
Chloride: 101 mmol/L (ref 96–106)
Creatinine, Ser: 0.86 mg/dL (ref 0.57–1.00)
GFR calc Af Amer: 102 mL/min/{1.73_m2} (ref >59)
GFR calc non Af Amer: 88 mL/min/{1.73_m2} (ref >59)
Globulin, Total: 2.6 g/dL (ref 1.5–4.5)
Glucose: 85 mg/dL (ref 65–99)
Potassium: 4.3 mmol/L (ref 3.5–5.2)
Sodium: 140 mmol/L (ref 134–144)
Total Protein: 7.8 g/dL (ref 6.0–8.5)

## 2018-05-20 LAB — LIPID PANEL
Chol/HDL Ratio: 3.9 ratio (ref 0.0–4.4)
Cholesterol, Total: 211 mg/dL — ABNORMAL HIGH (ref 100–199)
HDL: 54 mg/dL (ref >39)
LDL Calculated: 106 mg/dL — ABNORMAL HIGH (ref 0–99)
Triglycerides: 255 mg/dL — ABNORMAL HIGH (ref 0–149)
VLDL Cholesterol Cal: 51 mg/dL — ABNORMAL HIGH (ref 5–40)

## 2018-07-03 ENCOUNTER — Other Ambulatory Visit: Payer: Self-pay | Admitting: Family Medicine

## 2018-08-21 ENCOUNTER — Other Ambulatory Visit: Payer: Self-pay

## 2018-08-21 ENCOUNTER — Encounter: Payer: Self-pay | Admitting: Family Medicine

## 2018-08-21 ENCOUNTER — Ambulatory Visit (INDEPENDENT_AMBULATORY_CARE_PROVIDER_SITE_OTHER): Payer: No Typology Code available for payment source | Admitting: Family Medicine

## 2018-08-21 VITALS — BP 126/90 | HR 90 | Temp 98.9°F | Ht 66.5 in | Wt 185.0 lb

## 2018-08-21 DIAGNOSIS — R945 Abnormal results of liver function studies: Secondary | ICD-10-CM | POA: Diagnosis not present

## 2018-08-21 DIAGNOSIS — I10 Essential (primary) hypertension: Secondary | ICD-10-CM

## 2018-08-21 DIAGNOSIS — R7989 Other specified abnormal findings of blood chemistry: Secondary | ICD-10-CM

## 2018-08-21 NOTE — Progress Notes (Signed)
8/10/20203:26 PM  Michele Cunningham [birth year], 33 y.o., female [id number]  Chief Complaint  Patient presents with  . Hypertension    follow up bp, has not taking her meds for today    HPI:   Patient is a 33 y.o. female with past medical history significant for HTN, hypercalcemia, abnormal LFTs and panic attacks who presents today for routine followup  Last OV May 2020 Last calcium and AST improved  Usually takes amlodipine in the evenings Tolerating well Does not check BP at home  Does not take any calcium or vitamin D supplements Denies any frequent use of antiacids   Wt Readings from Last 3 Encounters:  08/21/18 185 lb (83.9 kg)  05/19/18 187 lb 6.4 oz (85 kg)  03/31/18 182 lb (82.6 kg)    Depression screen [facility] 2/9 08/21/2018 05/19/2018 03/08/2018  Decreased Interest 0 0 0  Down, Depressed, Hopeless 0 0 0  PHQ - 2 Score 0 0 0    Fall Risk  08/21/2018 05/19/2018 03/08/2018 12/14/2017 11/16/2017  Falls in the past year? 0 0 0 0 0  Number falls in past yr: 0 0 - - -  Injury with Fall? 0 0 - - -     No Known Allergies  Prior to Admission medications   Medication Sig Start Date End Date Taking? Authorizing Provider  ALPRAZolam (XANAX) 0.25 MG tablet Take 1 tablet (0.25 mg total) by mouth 2 (two) times daily as needed for anxiety. 03/08/18  Yes Shawnee Knapp, MD  amLODipine (NORVASC) 5 MG tablet TAKE 1 TABLET(5 MG) BY MOUTH DAILY 07/03/18  Yes Rutherford Guys, MD  Multiple Vitamins-Minerals (HAIR/SKIN/NAILS/BIOTIN PO) Take by mouth.   Yes [provider]    No past medical history on file.  No past surgical history on file.  Social History   Tobacco Use  . Smoking status: Never Smoker  . Smokeless tobacco: Never Used  Substance Use Topics  . Alcohol use: Yes    Comment: 3 - 4 days a week drinks     Family History  Problem Relation Age of Onset  . Cancer Mother        skin  . Hypertension Mother     Review of Systems  Constitutional: Negative for  chills and fever.  Eyes: Negative for blurred vision and double vision.  Respiratory: Negative for cough and shortness of breath.   Cardiovascular: Negative for chest pain, palpitations and leg swelling.  Gastrointestinal: Negative for abdominal pain, constipation, diarrhea, nausea and vomiting.  Musculoskeletal: Negative for joint pain.  Neurological: Negative for tremors, focal weakness and headaches.  Psychiatric/Behavioral: Negative for depression.     OBJECTIVE:  Today's Vitals   08/21/18 1519 08/21/18 1540  BP: (!) 148/102 126/90  Pulse: 90   Temp: 98.9 F (37.2 C)   TempSrc: Oral   SpO2: 96%   Weight: 185 lb (83.9 kg)   Height: 5' 6.5" (1.689 m)    Body mass index is 29.41 kg/m.   Physical Exam Vitals signs and nursing note reviewed.  Constitutional:      Appearance: She is well-developed.  HENT:     Head: Normocephalic and atraumatic.     Mouth/Throat:     Pharynx: No oropharyngeal exudate.  Eyes:     General: No scleral icterus.    Conjunctiva/sclera: Conjunctivae normal.     Pupils: Pupils are equal, round, and reactive to light.  Neck:     Musculoskeletal: Neck supple.  Cardiovascular:  Rate and Rhythm: Normal rate and regular rhythm.     Heart sounds: Normal heart sounds. No murmur. No friction rub. No gallop.   Pulmonary:     Effort: Pulmonary effort is normal.     Breath sounds: Normal breath sounds. No wheezing or rales.  Skin:    General: Skin is warm and dry.  Neurological:     Mental Status: She is alert and oriented to person, place, and time.     ASSESSMENT and PLAN  1. Essential hypertension Controlled. Continue current regime.  - Care order/instruction:  2. Hypercalcemia Checking labs, asymptomatic - CMP14+EGFR - PTH, Intact and Calcium - Vitamin D, 25-hydroxy  3. Abnormal LFTs Checking labs. Avoid liver toxins.  - CMP14+EGFR  Return in about 6 months (around 02/21/2019).    Irma M Santiago, MD [facility] at [facility]  [address] [place], [place] [address] Ph.  [phone] Fax [phone]   

## 2018-08-21 NOTE — Patient Instructions (Addendum)
   If you have lab work done today you will be contacted with your lab results within the next 2 weeks.  If you have not heard from us then please contact us. The fastest way to get your results is to register for My Chart.   IF you received an x-ray today, you will receive an invoice from [facility]. Please contact [facility] at [phone] with questions or concerns regarding your invoice.   IF you received labwork today, you will receive an invoice from [facility]. Please contact [facility] at [phone] with questions or concerns regarding your invoice.   Our billing staff will not be able to assist you with questions regarding bills from these companies.  You will be contacted with the lab results as soon as they are available. The fastest way to get your results is to activate your My Chart account. Instructions are located on the last page of this paperwork. If you have not heard from us regarding the results in 2 weeks, please contact this office.      Managing Your Hypertension Hypertension is commonly called high blood pressure. This is when the force of your blood pressing against the walls of your arteries is too strong. Arteries are blood vessels that carry blood from your heart throughout your body. Hypertension forces the heart to work harder to pump blood, and may cause the arteries to become narrow or stiff. Having untreated or uncontrolled hypertension can cause heart attack, stroke, kidney disease, and other problems. What are blood pressure readings? A blood pressure reading consists of a higher number over a lower number. Ideally, your blood pressure should be below 120/80. The first ("top") number is called the systolic pressure. It is a measure of the pressure in your arteries as your heart beats. The second ("bottom") number is called the diastolic pressure. It is a measure of the pressure in your arteries as the heart relaxes. What does my blood  pressure reading mean? Blood pressure is classified into four stages. Based on your blood pressure reading, your health care provider may use the following stages to determine what type of treatment you need, if any. Systolic pressure and diastolic pressure are measured in a unit called mm Hg. Normal  Systolic pressure: below 120.  Diastolic pressure: below 80. Elevated  Systolic pressure: 120-129.  Diastolic pressure: below 80. Hypertension stage 1  Systolic pressure: 130-139.  Diastolic pressure: 80-89. Hypertension stage 2  Systolic pressure: 140 or above.  Diastolic pressure: 90 or above. What health risks are associated with hypertension? Managing your hypertension is an important responsibility. Uncontrolled hypertension can lead to:  A heart attack.  A stroke.  A weakened blood vessel (aneurysm).  Heart failure.  Kidney damage.  Eye damage.  Metabolic syndrome.  Memory and concentration problems. What changes can I make to manage my hypertension? Hypertension can be managed by making lifestyle changes and possibly by taking medicines. Your health care provider will help you make a plan to bring your blood pressure within a normal range. Eating and drinking   Eat a diet that is high in fiber and potassium, and low in salt (sodium), added sugar, and fat. An example eating plan is called the DASH (Dietary Approaches to Stop Hypertension) diet. To eat this way: ? Eat plenty of fresh fruits and vegetables. Try to fill half of your plate at each meal with fruits and vegetables. ? Eat whole grains, such as whole wheat pasta, brown rice, or whole grain bread.   Fill about one quarter of your plate with whole grains. ? Eat low-fat diary products. ? Avoid fatty cuts of meat, processed or cured meats, and poultry with skin. Fill about one quarter of your plate with lean proteins such as fish, chicken without skin, beans, eggs, and tofu. ? Avoid premade and processed foods.  These tend to be higher in sodium, added sugar, and fat.  Reduce your daily sodium intake. Most people with hypertension should eat less than 1,500 mg of sodium a day.  Limit alcohol intake to no more than 1 drink a day for nonpregnant women and 2 drinks a day for men. One drink equals 12 oz of beer, 5 oz of wine, or 1 oz of hard liquor. Lifestyle  Work with your health care provider to maintain a healthy body weight, or to lose weight. Ask what an ideal weight is for you.  Get at least 30 minutes of exercise that causes your heart to beat faster (aerobic exercise) most days of the week. Activities may include walking, swimming, or biking.  Include exercise to strengthen your muscles (resistance exercise), such as weight lifting, as part of your weekly exercise routine. Try to do these types of exercises for 30 minutes at least 3 days a week.  Do not use any products that contain nicotine or tobacco, such as cigarettes and e-cigarettes. If you need help quitting, ask your health care provider.  Control any long-term (chronic) conditions you have, such as high cholesterol or diabetes. Monitoring  Monitor your blood pressure at home as told by your health care provider. Your personal target blood pressure may vary depending on your medical conditions, your age, and other factors.  Have your blood pressure checked regularly, as often as told by your health care provider. Working with your health care provider  Review all the medicines you take with your health care provider because there may be side effects or interactions.  Talk with your health care provider about your diet, exercise habits, and other lifestyle factors that may be contributing to hypertension.  Visit your health care provider regularly. Your health care provider can help you create and adjust your plan for managing hypertension. Will I need medicine to control my blood pressure? Your health care provider may prescribe  medicine if lifestyle changes are not enough to get your blood pressure under control, and if:  Your systolic blood pressure is 130 or higher.  Your diastolic blood pressure is 80 or higher. Take medicines only as told by your health care provider. Follow the directions carefully. Blood pressure medicines must be taken as prescribed. The medicine does not work as well when you skip doses. Skipping doses also puts you at risk for problems. Contact a health care provider if:  You think you are having a reaction to medicines you have taken.  You have repeated (recurrent) headaches.  You feel dizzy.  You have swelling in your ankles.  You have trouble with your vision. Get help right away if:  You develop a severe headache or confusion.  You have unusual weakness or numbness, or you feel faint.  You have severe pain in your chest or abdomen.  You vomit repeatedly.  You have trouble breathing. Summary  Hypertension is when the force of blood pumping through your arteries is too strong. If this condition is not controlled, it may put you at risk for serious complications.  Your personal target blood pressure may vary depending on your medical conditions, your age, and   other factors. For most people, a normal blood pressure is less than 120/80.  Hypertension is managed by lifestyle changes, medicines, or both. Lifestyle changes include weight loss, eating a healthy, low-sodium diet, exercising more, and limiting alcohol. This information is not intended to replace advice given to you by your health care provider. Make sure you discuss any questions you have with your health care provider. Document Released: 09/22/2011 Document Revised: 04/21/2018 Document Reviewed: 11/26/2015 Elsevier Patient Education  2020 Elsevier Inc.  

## 2018-08-22 LAB — CMP14+EGFR
ALT: 23 IU/L (ref 0–32)
AST: 36 IU/L (ref 0–40)
Albumin/Globulin Ratio: 1.9 (ref 1.2–2.2)
Albumin: 4.8 g/dL (ref 3.8–4.8)
Alkaline Phosphatase: 57 IU/L (ref 39–117)
BUN/Creatinine Ratio: 15 (ref 9–23)
BUN: 14 mg/dL (ref 6–20)
Bilirubin Total: 0.3 mg/dL (ref 0.0–1.2)
CO2: 20 mmol/L (ref 20–29)
Calcium: 9.7 mg/dL (ref 8.7–10.2)
Chloride: 103 mmol/L (ref 96–106)
Creatinine, Ser: 0.91 mg/dL (ref 0.57–1.00)
GFR calc Af Amer: 95 mL/min/{1.73_m2} (ref >59)
GFR calc non Af Amer: 83 mL/min/{1.73_m2} (ref >59)
Globulin, Total: 2.5 g/dL (ref 1.5–4.5)
Glucose: 102 mg/dL — ABNORMAL HIGH (ref 65–99)
Potassium: 3.9 mmol/L (ref 3.5–5.2)
Sodium: 142 mmol/L (ref 134–144)
Total Protein: 7.3 g/dL (ref 6.0–8.5)

## 2018-08-22 LAB — PTH, INTACT AND CALCIUM: PTH: 21 pg/mL (ref 15–65)

## 2018-08-22 LAB — VITAMIN D 25 HYDROXY (VIT D DEFICIENCY, FRACTURES): Vit D, 25-Hydroxy: 17.3 ng/mL — ABNORMAL LOW (ref 30.0–100.0)

## 2019-01-15 ENCOUNTER — Other Ambulatory Visit: Payer: Self-pay | Admitting: Family Medicine

## 2019-02-19 ENCOUNTER — Other Ambulatory Visit: Payer: Self-pay

## 2019-02-19 ENCOUNTER — Encounter: Payer: Self-pay | Admitting: Family Medicine

## 2019-02-19 ENCOUNTER — Ambulatory Visit (INDEPENDENT_AMBULATORY_CARE_PROVIDER_SITE_OTHER): Payer: No Typology Code available for payment source | Admitting: Family Medicine

## 2019-02-19 VITALS — BP 128/84 | HR 85 | Temp 98.5°F | Ht 66.5 in | Wt 187.6 lb

## 2019-02-19 DIAGNOSIS — I10 Essential (primary) hypertension: Secondary | ICD-10-CM

## 2019-02-19 DIAGNOSIS — F410 Panic disorder [episodic paroxysmal anxiety] without agoraphobia: Secondary | ICD-10-CM

## 2019-02-19 DIAGNOSIS — E559 Vitamin D deficiency, unspecified: Secondary | ICD-10-CM

## 2019-02-19 DIAGNOSIS — F430 Acute stress reaction: Secondary | ICD-10-CM

## 2019-02-19 MED ORDER — ALPRAZOLAM 0.25 MG PO TABS: 0.2500 mg | ORAL_TABLET | Freq: Two times a day (BID) | ORAL | 0 refills | Status: DC | PRN

## 2019-02-19 MED ORDER — AMLODIPINE BESYLATE 5 MG PO TABS: ORAL_TABLET | ORAL | 1 refills | Status: DC

## 2019-02-19 MED ORDER — VITAMIN D (ERGOCALCIFEROL) 1.25 MG (50000 UNIT) PO CAPS: 50000.0000 [IU] | ORAL_CAPSULE | ORAL | 0 refills | Status: DC

## 2019-02-19 NOTE — Progress Notes (Signed)
2/8/20212:21 PM  Michele Cunningham 01-Mar-1985, 34 y.o., female [id number]  Chief Complaint  Patient presents with  . Anxiety  . Hypertension  . vit D    did not get mychart message about starting vit d. May want to come bk for fasting labs    HPI:   Patient is a 34 y.o. female with past medical history significant for HTN, vitamin D deficiency and panic attacks who presents today for routine followup  Last OV aug 2020 Calcium and LFTs normalized, normal PTH Advised staring vitamin D which she did not   She is overall doing well Currently sign stressors at work Takes xanax very prn pmp reviewed, last rx a year ago Taking amlodipine daily Not exercising or dieting Her main source of vitamin D is cheese   Depression screen [facility] 2/9 08/21/2018 05/19/2018 03/08/2018  Decreased Interest 0 0 0  Down, Depressed, Hopeless 0 0 0  PHQ - 2 Score 0 0 0    Fall Risk  02/19/2019 08/21/2018 05/19/2018 03/08/2018 12/14/2017  Falls in the past year? 0 0 0 0 0  Number falls in past yr: 0 0 0 - -  Injury with Fall? 0 0 0 - -     No Known Allergies  Prior to Admission medications   Medication Sig Start Date End Date Taking? Authorizing Provider  ALPRAZolam (XANAX) 0.25 MG tablet Take 1 tablet (0.25 mg total) by mouth 2 (two) times daily as needed for anxiety. 03/08/18  Yes Shawnee Knapp, MD  amLODipine (NORVASC) 5 MG tablet TAKE 1 TABLET(5 MG) BY MOUTH DAILY 01/15/19  Yes Rutherford Guys, MD    No past medical history on file.  No past surgical history on file.  Social History   Tobacco Use  . Smoking status: Never Smoker  . Smokeless tobacco: Never Used  Substance Use Topics  . Alcohol use: Yes    Comment: 3 - 4 days a week drinks     Family History  Problem Relation Age of Onset  . Cancer Mother        skin  . Hypertension Mother     Review of Systems  Constitutional: Negative for chills and fever.  Respiratory: Negative for cough and shortness of breath.   Cardiovascular:  Negative for chest pain, palpitations and leg swelling.  Gastrointestinal: Negative for abdominal pain, nausea and vomiting.  Psychiatric/Behavioral: The patient does not have insomnia.   per hpi   OBJECTIVE:  Today's Vitals   02/19/19 1355  BP: 128/84  Pulse: 85  Temp: 98.5 F (36.9 C)  SpO2: 98%  Weight: 187 lb 9.6 oz (85.1 kg)  Height: 5' 6.5" (1.689 m)   Body mass index is 29.83 kg/m.  Wt Readings from Last 3 Encounters:  02/19/19 187 lb 9.6 oz (85.1 kg)  08/21/18 185 lb (83.9 kg)  05/19/18 187 lb 6.4 oz (85 kg)    Physical Exam Vitals and nursing note reviewed.  Constitutional:      Appearance: She is well-developed.  HENT:     Head: Normocephalic and atraumatic.     Mouth/Throat:     Pharynx: No oropharyngeal exudate.  Eyes:     General: No scleral icterus.    Conjunctiva/sclera: Conjunctivae normal.     Pupils: Pupils are equal, round, and reactive to light.  Cardiovascular:     Rate and Rhythm: Normal rate and regular rhythm.     Heart sounds: Normal heart sounds. No murmur. No friction rub. No gallop.  Pulmonary:     Effort: Pulmonary effort is normal.     Breath sounds: Normal breath sounds. No wheezing or rales.  Musculoskeletal:     Cervical back: Neck supple.  Skin:    General: Skin is warm and dry.  Neurological:     Mental Status: She is alert and oriented to person, place, and time.     No results found for this or any previous visit (from the past 24 hour(s)).  No results found.   ASSESSMENT and PLAN  1. Essential hypertension Controlled. Continue current regime.  - Lipid panel; Future - CMP14+EGFR; Future  2. Vitamin D deficiency Starting high dose vitamin d rx. Discussed OTC supplementation once rx completed  3. Panic attack as reaction to stress .stable. xanax prn. pmp reviewed  Other orders - Vitamin D, Ergocalciferol, (DRISDOL) 1.25 MG (50000 UNIT) CAPS capsule; Take 1 capsule (50,000 Units total) by mouth every 7 (seven)  days. - ALPRAZolam (XANAX) 0.25 MG tablet; Take 1 tablet (0.25 mg total) by mouth 2 (two) times daily as needed for anxiety. - amLODipine (NORVASC) 5 MG tablet; TAKE 1 TABLET(5 MG) BY MOUTH DAILY  Return in about 6 months (around 08/19/2019).    Rutherford Guys, MD Primary Care at [place], [place] [address] Ph.  [phone] Fax [phone]

## 2019-02-19 NOTE — Patient Instructions (Addendum)
  Once you complete the prescription for vitamin D, please start anover the counter D3 supplement of 2000 units daily   If you have lab work done today you will be contacted with your lab results within the next 2 weeks.  If you have not heard from [place] then please contact us. The fastest way to get your results is to register for My Chart.   IF you received an x-ray today, you will receive an invoice from [facility]. Please contact [facility] at [phone] with questions or concerns regarding your invoice.   IF you received labwork today, you will receive an invoice from [facility]. Please contact [facility] at [phone] with questions or concerns regarding your invoice.   Our billing staff will not be able to assist you with questions regarding bills from these companies.  You will be contacted with the lab results as soon as they are available. The fastest way to get your results is to activate your My Chart account. Instructions are located on the last page of this paperwork. If you have not heard from [place] regarding the results in 2 weeks, please contact this office.

## 2019-05-10 ENCOUNTER — Ambulatory Visit: Payer: No Typology Code available for payment source | Attending: Internal Medicine

## 2019-05-10 DIAGNOSIS — Z23 Encounter for immunization: Secondary | ICD-10-CM

## 2019-05-10 NOTE — Progress Notes (Signed)
   Covid-19 Vaccination Clinic  Name:  TYKERA SKATES    MRN: [id number] DOB: Nov 27, 1985  05/10/2019  Ms. Nethery was observed post Covid-19 immunization for 15 minutes without incident. She was provided with Vaccine Information Sheet and instruction to access the V-Safe system.   Ms. Rayborn was instructed to call 911 with any severe reactions post vaccine: Marland Kitchen Difficulty breathing  . Swelling of face and throat  . A fast heartbeat  . A bad rash all over body  . Dizziness and weakness   Immunizations Administered    Name Date Dose VIS Date Route   Pfizer COVID-19 Vaccine 05/10/2019  3:51 PM 0.3 mL 03/07/2018 Intramuscular   Manufacturer: [employer], [employer]   Lot: [id number]   NDC: [address]-1

## 2019-05-15 ENCOUNTER — Other Ambulatory Visit: Payer: Self-pay | Admitting: Family Medicine

## 2019-05-15 NOTE — Telephone Encounter (Signed)
Requested medications are due for refill today?  Yes - This medication strength cannot be delegated.    Requested medications are on active medication list?  Yes  Last Refill:   02/19/2019  # 12 with no refills  Future visit scheduled?  No   Notes to Clinic:  This medication strength cannot be delegated.

## 2019-06-04 ENCOUNTER — Ambulatory Visit: Payer: No Typology Code available for payment source | Attending: Internal Medicine

## 2019-06-04 DIAGNOSIS — Z23 Encounter for immunization: Secondary | ICD-10-CM

## 2019-06-04 NOTE — Progress Notes (Signed)
   Covid-19 Vaccination Clinic  Name:  TELESHA DEGUZMAN    MRN: [id number] DOB: 1985-05-27  06/04/2019  Ms. Tolley was observed post Covid-19 immunization for 15 minutes without incident. She was provided with Vaccine Information Sheet and instruction to access the V-Safe system.   Ms. Mandrell was instructed to call 911 with any severe reactions post vaccine: Marland Kitchen Difficulty breathing  . Swelling of face and throat  . A fast heartbeat  . A bad rash all over body  . Dizziness and weakness   Immunizations Administered    Name Date Dose VIS Date Route   Pfizer COVID-19 Vaccine 06/04/2019  3:49 PM 0.3 mL 03/07/2018 Intramuscular   Manufacturer: [employer], [employer]   Lot: [id number]   NDC: [address]-7

## 2019-08-03 ENCOUNTER — Ambulatory Visit (INDEPENDENT_AMBULATORY_CARE_PROVIDER_SITE_OTHER): Payer: No Typology Code available for payment source | Admitting: Family Medicine

## 2019-08-03 ENCOUNTER — Encounter: Payer: Self-pay | Admitting: Family Medicine

## 2019-08-03 ENCOUNTER — Other Ambulatory Visit: Payer: Self-pay

## 2019-08-03 DIAGNOSIS — I10 Essential (primary) hypertension: Secondary | ICD-10-CM | POA: Diagnosis not present

## 2019-08-03 MED ORDER — AMLODIPINE BESYLATE 10 MG PO TABS: 10.0000 mg | ORAL_TABLET | Freq: Every day | ORAL | 1 refills | Status: DC

## 2019-08-03 NOTE — Progress Notes (Signed)
7/23/20219:51 AM  Michele Cunningham 11-02-1985, 34 y.o., female [id number]  Chief Complaint  Patient presents with  . Hypertension    high reading yesterday 158/109 at dentist office - needing to have a cleaning and nerve managed - unable to proceed     HPI:   Patient is a 34 y.o. female with past medical history significant for HTN, vitamin D deficiency, panic attacks who presents today for BP   Last OV feb 2021 - BP was good then Went to dentist high bp Checking at home BP still high Takes amlodipine daily No new BC, no NSAIDs, no caffeine, no decongestants No increase salt intake No headaches, vision changes, chest pain, palpitations, SOB Sometimes snore Does not smoke   Depression screen [facility] 2/9 08/03/2019 08/03/2019 08/21/2018  Decreased Interest 0 0 0  Down, Depressed, Hopeless 0 0 0  PHQ - 2 Score 0 0 0    Fall Risk  08/03/2019 08/03/2019 02/19/2019 08/21/2018 05/19/2018  Falls in the past year? 0 0 0 0 0  Number falls in past yr: 0 0 0 0 0  Injury with Fall? 0 0 0 0 0  Follow up Falls evaluation completed Falls evaluation completed - - -     No Known Allergies  Prior to Admission medications   Medication Sig Start Date End Date Taking? Authorizing Provider  amLODipine (NORVASC) 5 MG tablet TAKE 1 TABLET(5 MG) BY MOUTH DAILY 02/19/19  Yes Myles Lipps, MD  ALPRAZolam Prudy Feeler) 0.25 MG tablet Take 1 tablet (0.25 mg total) by mouth 2 (two) times daily as needed for anxiety. Patient not taking: Reported on 08/03/2019 02/19/19   Myles Lipps, MD    No past medical history on file.  No past surgical history on file.  Social History   Tobacco Use  . Smoking status: Never Smoker  . Smokeless tobacco: Never Used  Substance Use Topics  . Alcohol use: Yes    Comment: 3 - 4 days a week drinks     Family History  Problem Relation Age of Onset  . Cancer Mother        skin  . Hypertension Mother     ROS Per hpi  OBJECTIVE:  Today's Vitals   08/03/19 0920  08/03/19 0929  BP: (!) 159/102 (!) 142/104  Pulse: 104   Temp: 98 F (36.7 C)   SpO2: 98%   Weight: 187 lb (84.8 kg)   Height: 5' 6.5" (1.689 m)    Body mass index is 29.73 kg/m.   BP Readings from Last 3 Encounters:  08/03/19 (!) 142/104  02/19/19 128/84  08/21/18 126/90   Wt Readings from Last 3 Encounters:  08/03/19 187 lb (84.8 kg)  02/19/19 187 lb 9.6 oz (85.1 kg)  08/21/18 185 lb (83.9 kg)     Physical Exam Vitals and nursing note reviewed.  Constitutional:      Appearance: She is well-developed.  HENT:     Head: Normocephalic and atraumatic.     Mouth/Throat:     Pharynx: No oropharyngeal exudate.  Eyes:     General: No scleral icterus.    Extraocular Movements: Extraocular movements intact.     Conjunctiva/sclera: Conjunctivae normal.     Pupils: Pupils are equal, round, and reactive to light.  Cardiovascular:     Rate and Rhythm: Normal rate and regular rhythm.     Heart sounds: Normal heart sounds. No murmur heard.  No friction rub. No gallop.   Pulmonary:  Effort: Pulmonary effort is normal.     Breath sounds: Normal breath sounds. No wheezing, rhonchi or rales.  Musculoskeletal:     Cervical back: Neck supple.     Right lower leg: No edema.     Left lower leg: No edema.  Skin:    General: Skin is warm and dry.  Neurological:     Mental Status: She is alert and oriented to person, place, and time.     No results found for this or any previous visit (from the past 24 hour(s)).  No results found.   ASSESSMENT and PLAN  1. Essential hypertension Not controlled. Increasing amlodipine to 10mg  daily. Discussed LFM. Discussed home BP monitoring, discussed mychart check-in in 1 week, if not at goal add maxzide. RTC precautions given - CBC - TSH - Basic Metabolic Panel  Other orders - amLODipine (NORVASC) 10 MG tablet; Take 1 tablet (10 mg total) by mouth daily.  Return in about 3 months (around 11/03/2019).    11/05/2019,  MD Primary Care at [facility] [address] [place], [place] Ph.  [phone] Fax [phone]

## 2019-08-03 NOTE — Patient Instructions (Addendum)
  Send me a Clinical cytogeneticist message in a week with your BP readings, if still not at goal, I will add a 2nd medication.   If you have lab work done today you will be contacted with your lab results within the next 2 weeks.  If you have not heard from [place] then please contact us. The fastest way to get your results is to register for My Chart.   IF you received an x-ray today, you will receive an invoice from [facility]. Please contact [facility] Radiology at [phone] with questions or concerns regarding your invoice.   IF you received labwork today, you will receive an invoice from [facility]. Please contact [facility] at [phone] with questions or concerns regarding your invoice.   Our billing staff will not be able to assist you with questions regarding bills from these companies.  You will be contacted with the lab results as soon as they are available. The fastest way to get your results is to activate your My Chart account. Instructions are located on the last page of this paperwork. If you have not heard from [place] regarding the results in 2 weeks, please contact this office.

## 2019-08-04 LAB — CBC
Hematocrit: 42.5 % (ref 34.0–46.6)
Hemoglobin: 14.2 g/dL (ref 11.1–15.9)
MCH: 29.7 pg (ref 26.6–33.0)
MCHC: 33.4 g/dL (ref 31.5–35.7)
MCV: 89 fL (ref 79–97)
Platelets: 379 10*3/uL (ref 150–450)
RBC: 4.78 x10E6/uL (ref 3.77–5.28)
RDW: 13.1 % (ref 11.7–15.4)
WBC: 7.6 10*3/uL (ref 3.4–10.8)

## 2019-08-04 LAB — BASIC METABOLIC PANEL
BUN/Creatinine Ratio: 12 (ref 9–23)
BUN: 9 mg/dL (ref 6–20)
CO2: 20 mmol/L (ref 20–29)
Calcium: 10.3 mg/dL — ABNORMAL HIGH (ref 8.7–10.2)
Chloride: 103 mmol/L (ref 96–106)
Creatinine, Ser: 0.75 mg/dL (ref 0.57–1.00)
GFR calc Af Amer: 119 mL/min/{1.73_m2} (ref >59)
GFR calc non Af Amer: 104 mL/min/{1.73_m2} (ref >59)
Glucose: 88 mg/dL (ref 65–99)
Potassium: 4.6 mmol/L (ref 3.5–5.2)
Sodium: 140 mmol/L (ref 134–144)

## 2019-08-04 LAB — TSH: TSH: 1.92 u[IU]/mL (ref 0.450–4.500)

## 2019-08-08 ENCOUNTER — Other Ambulatory Visit: Payer: Self-pay | Admitting: Family Medicine

## 2019-08-13 ENCOUNTER — Encounter: Payer: Self-pay | Admitting: Family Medicine

## 2019-08-14 MED ORDER — TRIAMTERENE-HCTZ 37.5-25 MG PO TABS
1.0000 | ORAL_TABLET | Freq: Every day | ORAL | 1 refills | Status: DC
Start: 2019-08-14 — End: 2020-01-29

## 2019-12-02 IMAGING — US US RENAL ARTERY STENOSIS
1 series · 14 of 25 positions shown · non-contrast
Comparison: None.

CLINICAL DATA: Hypertension

EXAM:
RENAL DUPLEX ULTRASOUND
TECHNIQUE: Duplex and color Doppler ultrasound was utilized to evaluate blood
flow in the renal arteries and kidneys.

[Series 1: us renal artery stenosis · 0.26mm/px · 14 of 64 slices shown]
[im 1/64]
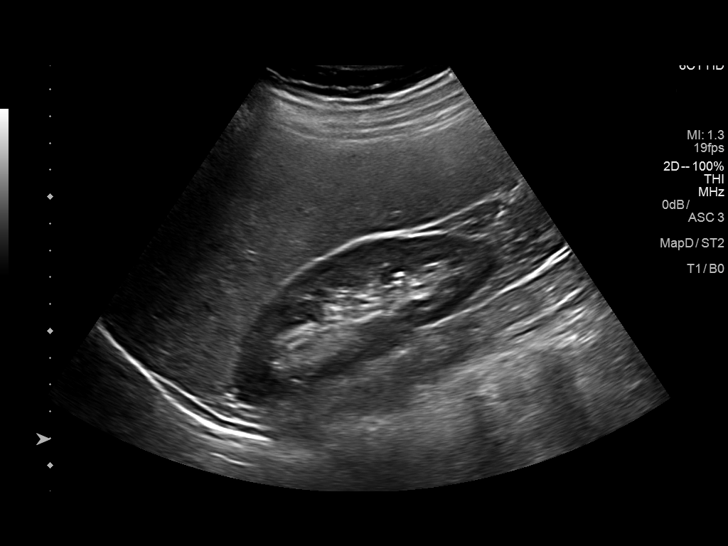
[im 6/64]
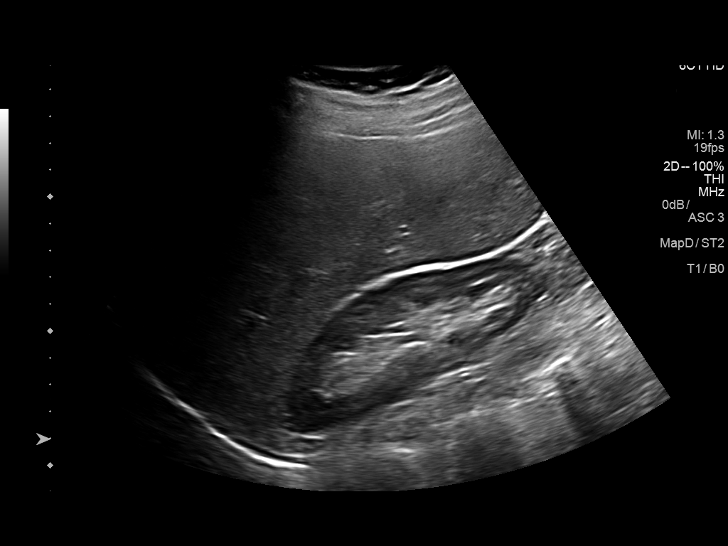
[im 11/64]
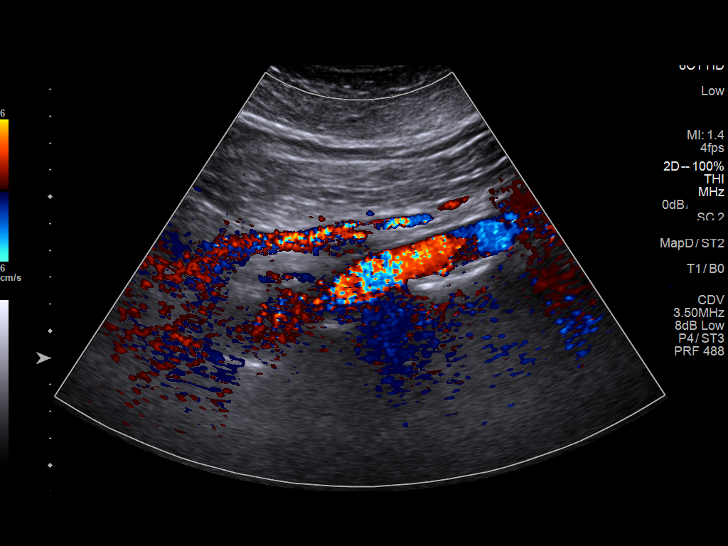
[im 16/64]
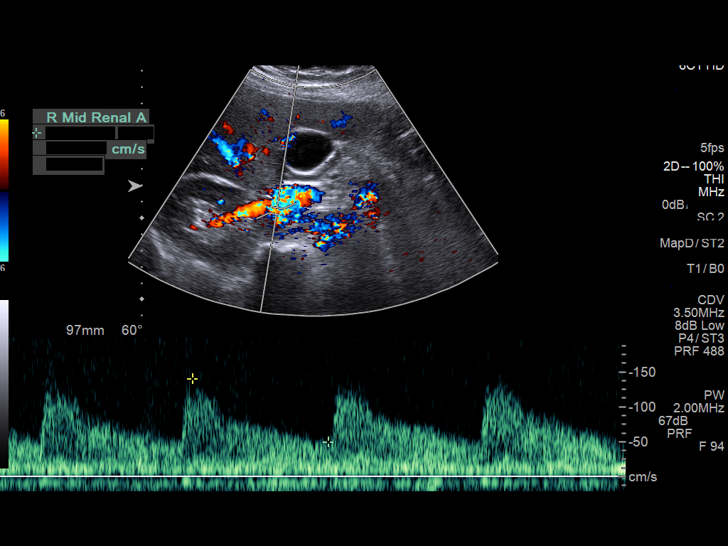
[im 22/64]
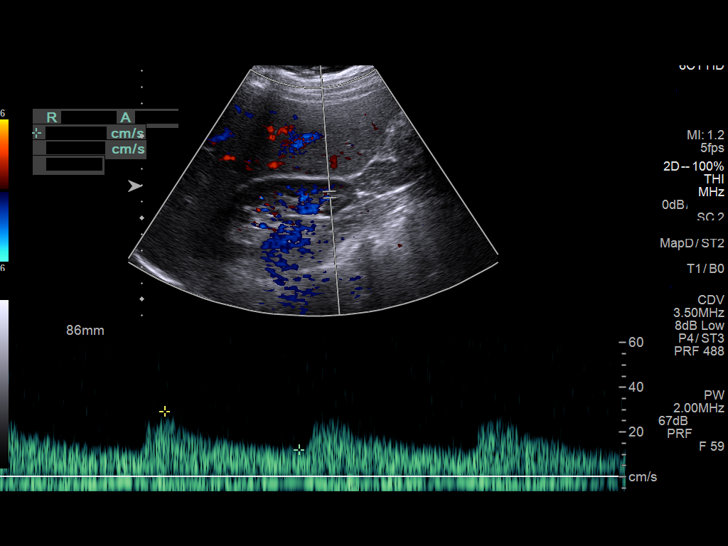
[im 24/64]
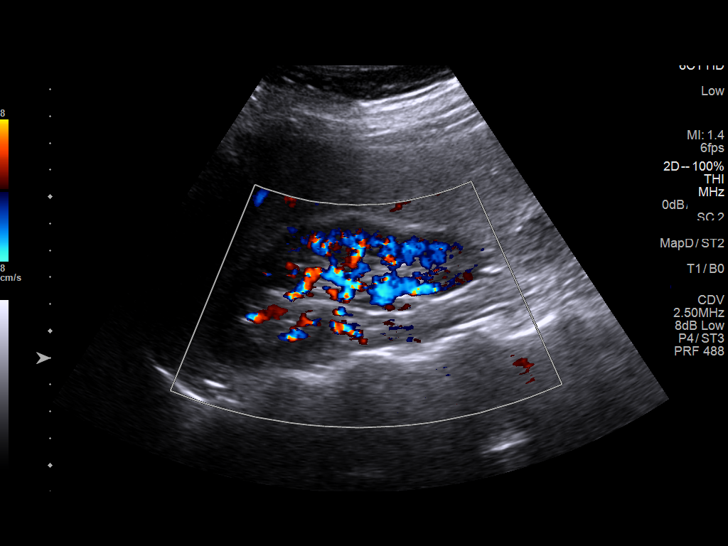
[im 29/64]
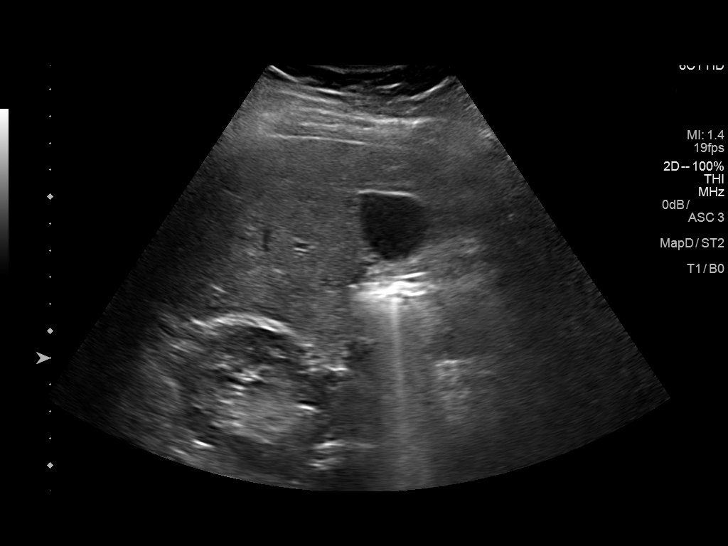
[im 35/64]
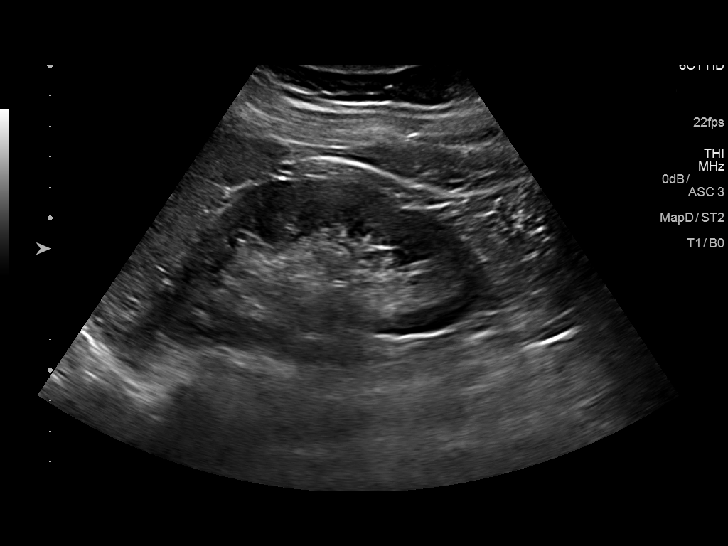
[im 40/64]
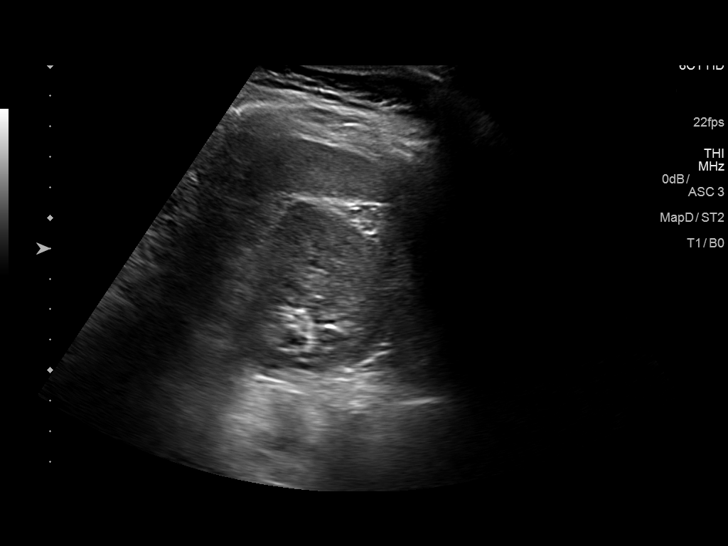
[im 43/64]
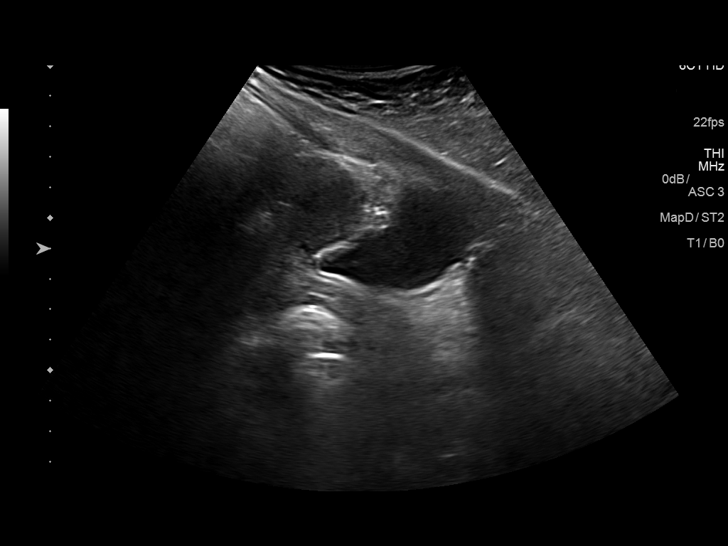
[im 48/64]
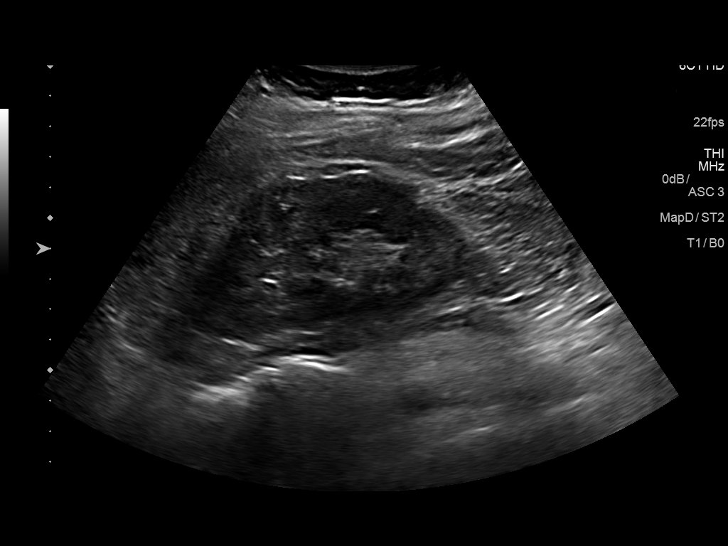
[im 53/64]
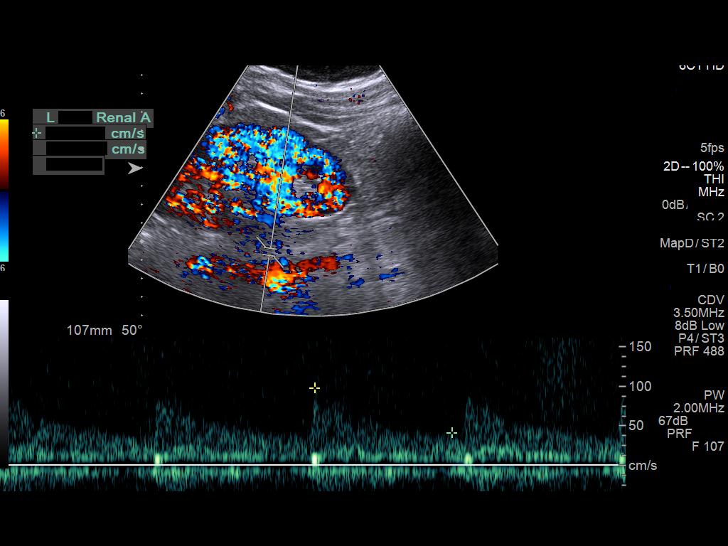
[im 58/64]
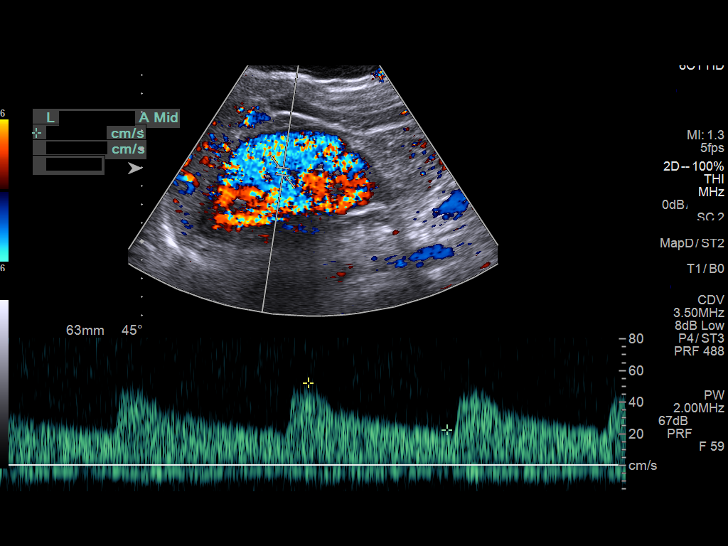
[im 64/64]
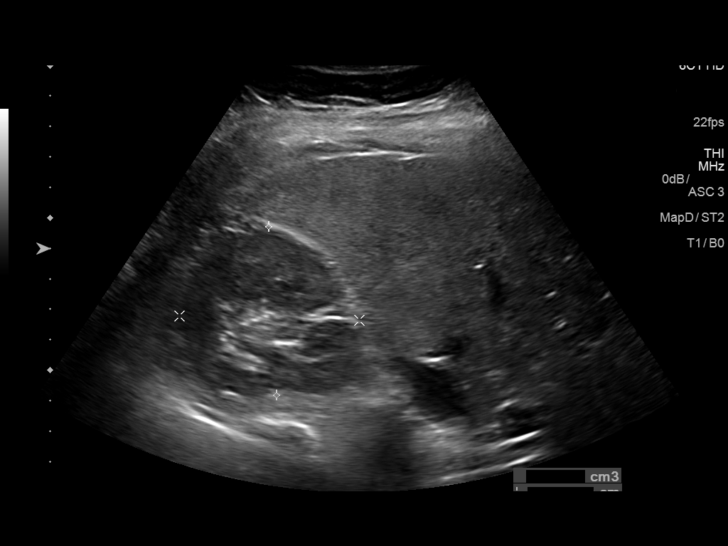

[14 of 25 positions shown; findings below may reference images not displayed]

FINDINGS: Right Renal Artery Velocities:

Origin:  169 cm/sec

Mid:  141 cm/sec

Hilum:  142 cm/sec

Interlobar:  49 cm/sec

Arcuate: 26 cm/sec

Left Renal Artery Velocities:

Origin:  98 cm/sec

Mid:  96 cm/sec

Hilum:  84 cm/sec

Interlobar:  48 cm/sec

Arcuate:  25 cm/sec

Aortic Velocity: 92 cm/sec

Right Renal-Aortic Ratios:

Origin:

Mid:

Hilum:

Interlobar:

Arcuate:

Left Renal-Aortic Ratios:

Origin: 1

Mid: 1

Hilum:

Interlobar:

Arcuate:

Kidneys are normal in size bilaterally with normal cortical
echogenicity. No hydronephrosis or obvious mass. Bladder is
decompressed. Renal veins are patent by color Doppler imaging
bilaterally.
IMPRESSION: No evidence of significant renal artery stenosis.

## 2020-01-29 ENCOUNTER — Ambulatory Visit (INDEPENDENT_AMBULATORY_CARE_PROVIDER_SITE_OTHER): Payer: No Typology Code available for payment source | Admitting: Family Medicine

## 2020-01-29 ENCOUNTER — Encounter: Payer: Self-pay | Admitting: Family Medicine

## 2020-01-29 ENCOUNTER — Other Ambulatory Visit: Payer: Self-pay

## 2020-01-29 VITALS — BP 170/112 | HR 80 | Temp 98.3°F | Ht 66.5 in | Wt 190.0 lb

## 2020-01-29 DIAGNOSIS — F410 Panic disorder [episodic paroxysmal anxiety] without agoraphobia: Secondary | ICD-10-CM

## 2020-01-29 DIAGNOSIS — F430 Acute stress reaction: Secondary | ICD-10-CM

## 2020-01-29 DIAGNOSIS — E559 Vitamin D deficiency, unspecified: Secondary | ICD-10-CM

## 2020-01-29 DIAGNOSIS — Z114 Encounter for screening for human immunodeficiency virus [HIV]: Secondary | ICD-10-CM

## 2020-01-29 DIAGNOSIS — I10 Essential (primary) hypertension: Secondary | ICD-10-CM

## 2020-01-29 DIAGNOSIS — Z1159 Encounter for screening for other viral diseases: Secondary | ICD-10-CM

## 2020-01-29 DIAGNOSIS — E785 Hyperlipidemia, unspecified: Secondary | ICD-10-CM | POA: Diagnosis not present

## 2020-01-29 MED ORDER — TRIAMTERENE-HCTZ 37.5-25 MG PO TABS
1.0000 | ORAL_TABLET | Freq: Every day | ORAL | 1 refills | Status: DC
Start: 2020-01-29 — End: 2020-09-02

## 2020-01-29 MED ORDER — AMLODIPINE BESYLATE 10 MG PO TABS: 10.0000 mg | ORAL_TABLET | Freq: Every day | ORAL | 1 refills | Status: DC

## 2020-01-29 MED ORDER — ALPRAZOLAM 0.25 MG PO TABS: 0.2500 mg | ORAL_TABLET | Freq: Two times a day (BID) | ORAL | 0 refills | Status: DC | PRN

## 2020-01-29 NOTE — Patient Instructions (Addendum)
Take BP daily    Hypertension, Adult Hypertension is another name for high blood pressure. High blood pressure forces your heart to work harder to pump blood. This can cause problems over time. There are two numbers in a blood pressure reading. There is a top number (systolic) over a bottom number (diastolic). It is best to have a blood pressure that is below 120/80. Healthy choices can help lower your blood pressure, or you may need medicine to help lower it. What are the causes? The cause of this condition is not known. Some conditions may be related to high blood pressure. What increases the risk?  Smoking.  Having type 2 diabetes mellitus, high cholesterol, or both.  Not getting enough exercise or physical activity.  Being overweight.  Having too much fat, sugar, calories, or salt (sodium) in your diet.  Drinking too much alcohol.  Having long-term (chronic) kidney disease.  Having a family history of high blood pressure.  Age. Risk increases with age.  Race. You may be at higher risk if you are African American.  Gender. Men are at higher risk than women before age 23. After age 29, women are at higher risk than men.  Having obstructive sleep apnea.  Stress. What are the signs or symptoms?  High blood pressure may not cause symptoms. Very high blood pressure (hypertensive crisis) may cause: ? Headache. ? Feelings of worry or nervousness (anxiety). ? Shortness of breath. ? Nosebleed. ? A feeling of being sick to your stomach (nausea). ? Throwing up (vomiting). ? Changes in how you see. ? Very bad chest pain. ? Seizures. How is this treated?  This condition is treated by making healthy lifestyle changes, such as: ? Eating healthy foods. ? Exercising more. ? Drinking less alcohol.  Your health care provider may prescribe medicine if lifestyle changes are not enough to get your blood pressure under control, and if: ? Your top number is above 130. ? Your  bottom number is above 80.  Your personal target blood pressure may vary. Follow these instructions at home: Eating and drinking  If told, follow the DASH eating plan. To follow this plan: ? Fill one half of your plate at each meal with fruits and vegetables. ? Fill one fourth of your plate at each meal with whole grains. Whole grains include whole-wheat pasta, brown rice, and whole-grain bread. ? Eat or drink low-fat dairy products, such as skim milk or low-fat yogurt. ? Fill one fourth of your plate at each meal with low-fat (lean) proteins. Low-fat proteins include fish, chicken without skin, eggs, beans, and tofu. ? Avoid fatty meat, cured and processed meat, or chicken with skin. ? Avoid pre-made or processed food.  Eat less than 1,500 mg of salt each day.  Do not drink alcohol if: ? Your doctor tells you not to drink. ? You are pregnant, may be pregnant, or are planning to become pregnant.  If you drink alcohol: ? Limit how much you use to:  0-1 drink a day for women.  0-2 drinks a day for men. ? Be aware of how much alcohol is in your drink. In the [place] one drink equals one 12 oz bottle of beer (355 mL), one 5 oz glass of wine (148 mL), or one 1 oz glass of hard liquor (44 mL).   Lifestyle  Work with your doctor to stay at a healthy weight or to lose weight. Ask your doctor what the best weight is for you.  Get at  least 30 minutes of exercise most days of the week. This may include walking, swimming, or biking.  Get at least 30 minutes of exercise that strengthens your muscles (resistance exercise) at least 3 days a week. This may include lifting weights or doing Pilates.  Do not use any products that contain nicotine or tobacco, such as cigarettes, e-cigarettes, and chewing tobacco. If you need help quitting, ask your doctor.  Check your blood pressure at home as told by your doctor.  Keep all follow-up visits as told by your doctor. This is important.    Medicines  Take over-the-counter and prescription medicines only as told by your doctor. Follow directions carefully.  Do not skip doses of blood pressure medicine. The medicine does not work as well if you skip doses. Skipping doses also puts you at risk for problems.  Ask your doctor about side effects or reactions to medicines that you should watch for. Contact a doctor if you:  Think you are having a reaction to the medicine you are taking.  Have headaches that keep coming back (recurring).  Feel dizzy.  Have swelling in your ankles.  Have trouble with your vision. Get help right away if you:  Get a very bad headache.  Start to feel mixed up (confused).  Feel weak or numb.  Feel faint.  Have very bad pain in your: ? Chest. ? Belly (abdomen).  Throw up more than once.  Have trouble breathing. Summary  Hypertension is another name for high blood pressure.  High blood pressure forces your heart to work harder to pump blood.  For most people, a normal blood pressure is less than 120/80.  Making healthy choices can help lower blood pressure. If your blood pressure does not get lower with healthy choices, you may need to take medicine. This information is not intended to replace advice given to you by your health care provider. Make sure you discuss any questions you have with your health care provider. Document Revised: 09/07/2017 Document Reviewed: 09/07/2017 Elsevier Patient Education  2021 [employer].   If you have lab work done today you will be contacted with your lab results within the next 2 weeks.  If you have not heard from [place] then please contact us. The fastest way to get your results is to register for My Chart.   IF you received an x-ray today, you will receive an invoice from [facility] Radiology. Please contact [facility] Radiology at [phone] with questions or concerns regarding your invoice.   IF you received labwork today, you will  receive an invoice from [place]. Please contact [facility] at [phone] with questions or concerns regarding your invoice.   Our billing staff will not be able to assist you with questions regarding bills from these companies.  You will be contacted with the lab results as soon as they are available. The fastest way to get your results is to activate your My Chart account. Instructions are located on the last page of this paperwork. If you have not heard from [place] regarding the results in 2 weeks, please contact this office.

## 2020-01-29 NOTE — Progress Notes (Signed)
1/18/20223:37 PM  Michele Cunningham 07-02-1985, 35 y.o., female [id number]  Chief Complaint  Patient presents with  . Hypertension    Pt only taking amlodipine / did not take maxide     HPI:   Patient is a 35 y.o. female with past medical history significant for HTN and anxiety who presents today for medication refills.  Maxide started last appointment Had started and then stopped Taking Amlodipine 8m daily Not checking BP at home Denies symptoms of HTN: vision changes,headaches, sensory changes Denies any acute issues at this time  Recently separated from husband Moved out of house Using Xanax more often ( once or twice a week) Verbalizes strong support system   [facility] 01/29/2020 08/03/2019 08/03/2019  Decreased Interest 0 0 0  Down, Depressed, Hopeless 0 0 0  PHQ - 2 Score 0 0 0    Fall Risk  01/29/2020 08/03/2019 08/03/2019 02/19/2019 08/21/2018  Falls in the past year? 0 0 0 0 0  Number falls in past yr: 0 0 0 0 0  Injury with Fall? 0 0 0 0 0  Follow up Falls evaluation completed Falls evaluation completed Falls evaluation completed - -     No Known Allergies  Prior to Admission medications   Medication Sig Start Date End Date Taking? Authorizing Provider  amLODipine (NORVASC) 10 MG tablet Take 1 tablet (10 mg total) by mouth daily. 08/03/19  Yes SJacelyn Pi ILilia Argue MD  ALPRAZolam (Duanne Moron 0.25 MG tablet Take 1 tablet (0.25 mg total) by mouth 2 (two) times daily as needed for anxiety. Patient not taking: Reported on 01/29/2020 02/19/19   SJacelyn Pi ILilia Argue MD  triamterene-hydrochlorothiazide (MAXZIDE-25) 37.5-25 MG tablet Take 1 tablet by mouth daily. Patient not taking: Reported on 01/29/2020 08/14/19   SJacelyn Pi ILilia Argue MD    History reviewed. No pertinent past medical history.  History reviewed. No pertinent surgical history.  Social History   Tobacco Use  . Smoking status: Never Smoker  . Smokeless tobacco: Never Used  Substance Use  Topics  . Alcohol use: Yes    Comment: 3 - 4 days a week drinks     Family History  Problem Relation Age of Onset  . Cancer Mother        skin  . Hypertension Mother     Review of Systems  Constitutional: Negative for chills, fever and malaise/fatigue.  Eyes: Negative for blurred vision and double vision.  Respiratory: Negative for cough, shortness of breath and wheezing.   Cardiovascular: Negative for chest pain, palpitations and leg swelling.  Gastrointestinal: Negative for abdominal pain, blood in stool, constipation, diarrhea, heartburn, nausea and vomiting.  Genitourinary: Negative for dysuria, frequency and hematuria.  Musculoskeletal: Negative for back pain and joint pain.  Skin: Negative for rash.  Neurological: Negative for dizziness, tingling, weakness and headaches.     OBJECTIVE:  Today's Vitals   01/29/20 1515 01/29/20 1534  BP: (!) 177/117 (!) 170/112  Pulse: 80   Temp: 98.3 F (36.8 C)   SpO2: 96%   Weight: 190 lb (86.2 kg)   Height: 5' 6.5" (1.689 m)    Body mass index is 30.21 kg/m.   Physical Exam Constitutional:      General: She is not in acute distress.    Appearance: Normal appearance. She is not ill-appearing.  HENT:     Head: Normocephalic.  Cardiovascular:     Rate and Rhythm: Normal rate and regular rhythm.     Pulses:  Normal pulses.     Heart sounds: Normal heart sounds. No murmur heard. No friction rub. No gallop.   Pulmonary:     Effort: Pulmonary effort is normal. No respiratory distress.     Breath sounds: Normal breath sounds. No stridor. No wheezing, rhonchi or rales.  Abdominal:     General: Bowel sounds are normal.     Palpations: Abdomen is soft.     Tenderness: There is no abdominal tenderness.  Musculoskeletal:     Right lower leg: No edema.     Left lower leg: No edema.  Skin:    General: Skin is warm and dry.  Neurological:     Mental Status: She is alert and oriented to person, place, and time.  Psychiatric:         Mood and Affect: Mood normal.        Behavior: Behavior normal.     No results found for this or any previous visit (from the past 24 hour(s)).  No results found.   ASSESSMENT and PLAN  Problem List Items Addressed This Visit      Cardiovascular and Mediastinum   Essential hypertension   Relevant Medications   amLODipine (NORVASC) 10 MG tablet   triamterene-hydrochlorothiazide (MAXZIDE-25) 37.5-25 MG tablet   Other Relevant Orders   CMP14+EGFR     Other   Panic attack as reaction to stress   Relevant Medications   ALPRAZolam (XANAX) 0.25 MG tablet   Hyperlipidemia   Relevant Medications   amLODipine (NORVASC) 10 MG tablet   triamterene-hydrochlorothiazide (MAXZIDE-25) 37.5-25 MG tablet   Other Relevant Orders   Lipid Panel    Other Visit Diagnoses    Vitamin D deficiency    -  Primary   Relevant Orders   Vitamin D, 25-hydroxy   Encounter for hepatitis C screening test for low risk patient       Relevant Orders   Hepatitis C antibody   Encounter for screening for HIV       Relevant Orders   HIV Antibody (routine testing w rflx)       Plan . Restart maxide and amlodipine daily . Take BP at home daily . Follow up in 2 weeks with BP readings   Return in about 3 months (around 04/28/2020).    Michele Foley Olimpia Tinch, FNP-BC Primary Care at [facility], [place] [address] Ph.  [phone] Fax [phone]

## 2020-01-30 LAB — CMP14+EGFR
ALT: 28 IU/L (ref 0–32)
AST: 36 IU/L (ref 0–40)
Albumin/Globulin Ratio: 2 (ref 1.2–2.2)
Albumin: 5.2 g/dL — ABNORMAL HIGH (ref 3.8–4.8)
Alkaline Phosphatase: 73 IU/L (ref 44–121)
BUN/Creatinine Ratio: 17 (ref 9–23)
BUN: 10 mg/dL (ref 6–20)
Bilirubin Total: 0.3 mg/dL (ref 0.0–1.2)
CO2: 23 mmol/L (ref 20–29)
Calcium: 10.6 mg/dL — ABNORMAL HIGH (ref 8.7–10.2)
Chloride: 100 mmol/L (ref 96–106)
Creatinine, Ser: 0.6 mg/dL (ref 0.57–1.00)
GFR calc Af Amer: 136 mL/min/{1.73_m2} (ref >59)
GFR calc non Af Amer: 118 mL/min/{1.73_m2} (ref >59)
Globulin, Total: 2.6 g/dL (ref 1.5–4.5)
Glucose: 90 mg/dL (ref 65–99)
Potassium: 4.3 mmol/L (ref 3.5–5.2)
Sodium: 138 mmol/L (ref 134–144)
Total Protein: 7.8 g/dL (ref 6.0–8.5)

## 2020-01-30 LAB — HIV ANTIBODY (ROUTINE TESTING W REFLEX): HIV Screen 4th Generation wRfx: NONREACTIVE

## 2020-01-30 LAB — LIPID PANEL
Chol/HDL Ratio: 4.3 ratio (ref 0.0–4.4)
Cholesterol, Total: 204 mg/dL — ABNORMAL HIGH (ref 100–199)
HDL: 47 mg/dL (ref >39)
LDL Chol Calc (NIH): 80 mg/dL (ref 0–99)
Triglycerides: 483 mg/dL — ABNORMAL HIGH (ref 0–149)
VLDL Cholesterol Cal: 77 mg/dL — ABNORMAL HIGH (ref 5–40)

## 2020-01-30 LAB — VITAMIN D 25 HYDROXY (VIT D DEFICIENCY, FRACTURES): Vit D, 25-Hydroxy: 17.5 ng/mL — ABNORMAL LOW (ref 30.0–100.0)

## 2020-01-30 LAB — HEPATITIS C ANTIBODY: Hep C Virus Ab: <0.1 s/co ratio (ref 0.0–0.9)

## 2020-02-26 ENCOUNTER — Ambulatory Visit: Payer: No Typology Code available for payment source | Admitting: Family Medicine

## 2020-03-05 ENCOUNTER — Encounter: Payer: Self-pay | Admitting: Family Medicine

## 2020-04-22 ENCOUNTER — Ambulatory Visit: Payer: No Typology Code available for payment source | Admitting: Family Medicine

## 2020-04-26 ENCOUNTER — Other Ambulatory Visit: Payer: Self-pay | Admitting: Family Medicine

## 2020-04-26 DIAGNOSIS — I10 Essential (primary) hypertension: Secondary | ICD-10-CM

## 2020-06-20 ENCOUNTER — Other Ambulatory Visit: Payer: Self-pay

## 2020-06-20 ENCOUNTER — Encounter (HOSPITAL_COMMUNITY): Payer: Self-pay | Admitting: Emergency Medicine

## 2020-06-20 ENCOUNTER — Ambulatory Visit (HOSPITAL_COMMUNITY)
Admission: EM | Admit: 2020-06-20 | Discharge: 2020-06-20 | Disposition: A | Discharge status: Home / Self Care | Payer: No Typology Code available for payment source | Attending: Urgent Care | Admitting: Urgent Care

## 2020-06-20 DIAGNOSIS — I10 Essential (primary) hypertension: Secondary | ICD-10-CM

## 2020-06-20 DIAGNOSIS — R0981 Nasal congestion: Secondary | ICD-10-CM

## 2020-06-20 DIAGNOSIS — R059 Cough, unspecified: Secondary | ICD-10-CM

## 2020-06-20 DIAGNOSIS — J018 Other acute sinusitis: Secondary | ICD-10-CM | POA: Diagnosis not present

## 2020-06-20 DIAGNOSIS — J029 Acute pharyngitis, unspecified: Secondary | ICD-10-CM

## 2020-06-20 MED ORDER — CETIRIZINE HCL 10 MG PO TABS: 10.0000 mg | ORAL_TABLET | Freq: Every day | ORAL | 0 refills | Status: DC

## 2020-06-20 MED ORDER — AMOXICILLIN 875 MG PO TABS: 875.0000 mg | ORAL_TABLET | Freq: Two times a day (BID) | ORAL | 0 refills | Status: DC

## 2020-06-20 NOTE — ED Triage Notes (Signed)
Pt presents with sore throat, cough, and congestion Xs 1 week. States ibuprofen does give relief.

## 2020-06-20 NOTE — Discharge Instructions (Addendum)

## 2020-06-20 NOTE — ED Provider Notes (Addendum)
[facility]   MRN: [id number] DOB: 1985-09-29  Subjective:   Michele Cunningham is a 35 y.o. female presenting for week history of persistent and worsening sinus skin rash, sinus throat pain, cough pain in her throat is worse in the morning. Feels like she has a lot of mucus on her throat.  Has been using ibuprofen and over-the-counter medications with minimal relief.  Does not care for COVID test.  Denies fever, headache, chest pain, shortness of breath, wheezing, body aches, joint pains.  Denies weakness, confusion, numbness or tingling, vision change.  Patient takes amlodipine daily consistently but not her Maxide.  She is not a smoker.  Has a few drinks a week.  No current facility-administered medications for this encounter.  Current Outpatient Medications:    ALPRAZolam (XANAX) 0.25 MG tablet, Take 1 tablet (0.25 mg total) by mouth 2 (two) times daily as needed for anxiety., Disp: 20 tablet, Rfl: 0   amLODipine (NORVASC) 10 MG tablet, Take 1 tablet (10 mg total) by mouth daily., Disp: 90 tablet, Rfl: 1   triamterene-hydrochlorothiazide (MAXZIDE-25) 37.5-25 MG tablet, Take 1 tablet by mouth daily., Disp: 90 tablet, Rfl: 1   No Known Allergies  History reviewed. No pertinent past medical history.   History reviewed. No pertinent surgical history.  Family History  Problem Relation Age of Onset   Cancer Mother        skin   Hypertension Mother     Social History   Tobacco Use   Smoking status: Never   Smokeless tobacco: Never  Vaping Use   Vaping Use: Never used  Substance Use Topics   Alcohol use: Yes    Comment: 3 - 4 days a week drinks     ROS   Objective:   Vitals: BP (!) 156/102 (BP Location: Right Arm)   Pulse 88   Temp 98.8 F (37.1 C) (Oral)   Resp 17   LMP 05/26/2020   SpO2 100%   BP Readings from Last 3 Encounters:  06/20/20 (!) 156/102  01/29/20 (!) 170/112  08/03/19 (!) 142/104   Physical Exam Constitutional:      General: She  is not in acute distress.    Appearance: Normal appearance. She is well-developed. She is not ill-appearing, toxic-appearing or diaphoretic.  HENT:     Head: Normocephalic and atraumatic.     Nose: Nose normal.     Mouth/Throat:     Mouth: Mucous membranes are moist.  Eyes:     Extraocular Movements: Extraocular movements intact.     Pupils: Pupils are equal, round, and reactive to light.  Cardiovascular:     Rate and Rhythm: Normal rate and regular rhythm.     Pulses: Normal pulses.     Heart sounds: Normal heart sounds. No murmur heard.   No friction rub. No gallop.  Pulmonary:     Effort: Pulmonary effort is normal. No respiratory distress.     Breath sounds: Normal breath sounds. No stridor. No wheezing, rhonchi or rales.  Skin:    General: Skin is warm and dry.     Findings: No rash.  Neurological:     Mental Status: She is alert and oriented to person, place, and time.     Cranial Nerves: No cranial nerve deficit.     Motor: No weakness.     Coordination: Coordination normal.     Gait: Gait normal.     Deep Tendon Reflexes: Reflexes normal.  Psychiatric:  Mood and Affect: Mood normal.        Behavior: Behavior normal.        Thought Content: Thought content normal.        Judgment: Judgment normal.      Assessment and Plan :   PDMP not reviewed this encounter.  1. Acute non-recurrent sinusitis of other sinus   2. Nasal congestion   3. Cough   4. Sore throat   5. Essential hypertension     Will start empiric treatment for sinusitis with amoxicillin.  Recommended supportive care otherwise including the use of oral antihistamine, decongestant.  Regarding her blood pressure, recommended better compliance with her blood pressure medications.  Restart triamterene hydrochlorothiazide, maintain amlodipine.  Counseled on dietary modifications.  Follow-up with your PCP.  Counseled patient on potential for adverse effects with medications prescribed/recommended today,  ER and return-to-clinic precautions discussed, patient verbalized understanding.   Wallis Bamberg, [place] 06/20/20 1123

## 2020-09-02 ENCOUNTER — Ambulatory Visit (INDEPENDENT_AMBULATORY_CARE_PROVIDER_SITE_OTHER): Payer: No Typology Code available for payment source | Admitting: Physician Assistant

## 2020-09-02 ENCOUNTER — Encounter: Payer: Self-pay | Admitting: Physician Assistant

## 2020-09-02 ENCOUNTER — Other Ambulatory Visit: Payer: Self-pay

## 2020-09-02 VITALS — BP 150/90 | HR 96 | Temp 98.0°F | Ht 67.25 in | Wt 192.0 lb

## 2020-09-02 DIAGNOSIS — F410 Panic disorder [episodic paroxysmal anxiety] without agoraphobia: Secondary | ICD-10-CM | POA: Diagnosis not present

## 2020-09-02 DIAGNOSIS — F430 Acute stress reaction: Secondary | ICD-10-CM | POA: Diagnosis not present

## 2020-09-02 DIAGNOSIS — I10 Essential (primary) hypertension: Secondary | ICD-10-CM | POA: Diagnosis not present

## 2020-09-02 MED ORDER — AMLODIPINE BESYLATE 10 MG PO TABS: 10.0000 mg | ORAL_TABLET | Freq: Every day | ORAL | 1 refills | Status: DC

## 2020-09-02 MED ORDER — VALSARTAN 80 MG PO TABS
80.0000 mg | ORAL_TABLET | Freq: Every day | ORAL | 1 refills | Status: DC
Start: 2020-09-02 — End: 2020-09-09

## 2020-09-02 MED ORDER — ALPRAZOLAM 0.25 MG PO TABS: 0.2500 mg | ORAL_TABLET | Freq: Two times a day (BID) | ORAL | 0 refills | Status: DC | PRN

## 2020-09-02 NOTE — Patient Instructions (Signed)
It was great to see you!  Stop triamterene-HCTZ  Continue Norvasc 10 mg daily  Start Valsartan 80 mg daily  Xanax refill sent  Please keep blood pressure log for me -- check twice per week  **These medications are NOT safe for pregnancy. If you are considering getting pregnant, please come see me and we can change you to safer medications.  Let's follow-up in 3 months, sooner if you have concerns.  Take care,  Jarold Motto PA-C

## 2020-09-02 NOTE — Progress Notes (Signed)
Michele Cunningham is a 35 y.o. female here to establish care and hypertension.  I acted as a Neurosurgeon for [employer], PA-C [employer], LPN    History of Present Illness:   Chief Complaint  Patient presents with   Establish Care   Hypertension    Hypertension Currently prescribed Amlodipine 10 mg daily, Triamterene-HCTZ 37.5-25 mg daily. She is not taking the Triamterene-HCTZ consistently because it causes frequent/increased urination and is not helpful for her lifestyle. Pt denies headaches, dizziness, blurred vision, chest pain, SOB or lower leg edema. Denies excessive caffeine intake, stimulant usage, excessive alcohol intake or increase in salt consumption.  Anxiety Overall well controlled. Takes xanax 0.25 mg 1-2 times per month. No prior SSRI or other medication use. She is going through a separation.   Health Maintenance: Immunizations -- UTD, declines Tdap Colonoscopy -- N/A Mammogram -- N/A PAP -- UTD, due 2023 Weight -- Weight: 192 lb (87.1 kg)    Depression screen [facility] 2/9 09/02/2020  Decreased Interest 0  Down, Depressed, Hopeless 0  PHQ - 2 Score 0    GAD 7 : Generalized Anxiety Score 09/02/2020 08/03/2019 02/19/2019  Nervous, Anxious, on Edge 1 0 1  Control/stop worrying 0 0 1  Worry too much - different things 1 0 1  Trouble relaxing 1 0 0  Restless 0 0 0  Easily annoyed or irritable 0 0 0  Afraid - awful might happen 0 0 0  Total GAD 7 Score 3 0 3  Anxiety Difficulty Not difficult at all Not difficult at all Not difficult at all     Other providers/specialists: Patient Care Team: Jarold Motto, [place] as PCP - General (Physician Assistant)   Past Medical History:  Diagnosis Date   Anxiety    Hypertension      Social History   Tobacco Use   Smoking status: Never   Smokeless tobacco: Never  Vaping Use   Vaping Use: Never used  Substance Use Topics   Alcohol use: Yes    Alcohol/week: 3.0 - 4.0 standard drinks    Types: 3 - 4 Glasses of wine  per week   Drug use: Never    History reviewed. No pertinent surgical history.  Family History  Problem Relation Age of Onset   Cancer Mother        skin   Hypertension Mother    Hypertension Sister     No Known Allergies   Current Medications:   Current Outpatient Medications:    valsartan (DIOVAN) 80 MG tablet, Take 1 tablet (80 mg total) by mouth daily., Disp: 90 tablet, Rfl: 1   ALPRAZolam (XANAX) 0.25 MG tablet, Take 1 tablet (0.25 mg total) by mouth 2 (two) times daily as needed for anxiety., Disp: 20 tablet, Rfl: 0   amLODipine (NORVASC) 10 MG tablet, Take 1 tablet (10 mg total) by mouth daily., Disp: 90 tablet, Rfl: 1   Review of Systems:   ROS Negative unless otherwise specified per HPI.  Vitals:   Vitals:   09/02/20 1521  BP: (!) 150/90  Pulse: 96  Temp: 98 F (36.7 C)  TempSrc: Temporal  SpO2: 98%  Weight: 192 lb (87.1 kg)  Height: 5' 7.25" (1.708 m)      Body mass index is 29.85 kg/m.  Physical Exam:   Physical Exam Vitals and nursing note reviewed.  Constitutional:      General: She is not in acute distress.    Appearance: She is well-developed. She is not ill-appearing or  toxic-appearing.  Cardiovascular:     Rate and Rhythm: Normal rate and regular rhythm.     Pulses: Normal pulses.     Heart sounds: Normal heart sounds, S1 normal and S2 normal.     Comments: No LE edema Pulmonary:     Effort: Pulmonary effort is normal.     Breath sounds: Normal breath sounds.  Skin:    General: Skin is warm and dry.  Neurological:     Mental Status: She is alert.     GCS: GCS eye subscore is 4. GCS verbal subscore is 5. GCS motor subscore is 6.  Psychiatric:        Speech: Speech normal.        Behavior: Behavior normal. Behavior is cooperative.    Assessment and Plan:   Audi was seen today for establish care and hypertension.  Diagnoses and all orders for this visit:  Essential hypertension Above goal Update BMP today Non-compliant with  triamterene-hctz due to side effects Stop triamterene-hctz Continue norvasc 10 mg daily Add valsartan 80 mg daily BP log given Advised that if she is considering pregnancy to let us know as these are not pregnancy safe medications Follow-up in 3 months, sooner if concerns -     Comprehensive metabolic panel -     amLODipine (NORVASC) 10 MG tablet; Take 1 tablet (10 mg total) by mouth daily.  Panic attack as reaction to stress PDMP reviewed No red flags Xanax refill sent today -     ALPRAZolam (XANAX) 0.25 MG tablet; Take 1 tablet (0.25 mg total) by mouth 2 (two) times daily as needed for anxiety.  Other orders -     valsartan (DIOVAN) 80 MG tablet; Take 1 tablet (80 mg total) by mouth daily.  CMA or LPN served as scribe during this visit. History, Physical, and Plan performed by medical provider. The above documentation has been reviewed and is accurate and complete.  Jarold Motto, PA-C

## 2020-09-03 LAB — COMPREHENSIVE METABOLIC PANEL
ALT: 65 U/L — ABNORMAL HIGH (ref 0–35)
AST: 66 U/L — ABNORMAL HIGH (ref 0–37)
Albumin: 5 g/dL (ref 3.5–5.2)
Alkaline Phosphatase: 52 U/L (ref 39–117)
BUN: 13 mg/dL (ref 6–23)
CO2: 23 mEq/L (ref 19–32)
Calcium: 10.1 mg/dL (ref 8.4–10.5)
Chloride: 99 mEq/L (ref 96–112)
Creatinine, Ser: 0.76 mg/dL (ref 0.40–1.20)
GFR: 100.46 mL/min (ref >60.00)
Glucose, Bld: 98 mg/dL (ref 70–99)
Potassium: 3.9 mEq/L (ref 3.5–5.1)
Sodium: 135 mEq/L (ref 135–145)
Total Bilirubin: 0.5 mg/dL (ref 0.2–1.2)
Total Protein: 8.5 g/dL — ABNORMAL HIGH (ref 6.0–8.3)

## 2020-09-05 ENCOUNTER — Ambulatory Visit: Payer: No Typology Code available for payment source | Admitting: Physician Assistant

## 2020-09-08 ENCOUNTER — Encounter: Payer: Self-pay | Admitting: Physician Assistant

## 2020-09-09 ENCOUNTER — Other Ambulatory Visit: Payer: Self-pay | Admitting: Physician Assistant

## 2020-09-09 MED ORDER — LOSARTAN POTASSIUM 50 MG PO TABS
50.0000 mg | ORAL_TABLET | Freq: Every day | ORAL | 0 refills | Status: DC
Start: 2020-09-09 — End: 2020-12-08

## 2020-10-08 ENCOUNTER — Telehealth: Payer: No Typology Code available for payment source | Admitting: Physician Assistant

## 2020-10-08 DIAGNOSIS — B372 Candidiasis of skin and nail: Secondary | ICD-10-CM | POA: Diagnosis not present

## 2020-10-08 MED ORDER — NYSTATIN 100000 UNIT/GM EX OINT: 1.0000 "application " | TOPICAL_OINTMENT | Freq: Two times a day (BID) | CUTANEOUS | 0 refills | Status: DC

## 2020-10-08 NOTE — Progress Notes (Signed)
E Visit for Rash  We are sorry that you are not feeling well. Here is how we plan to help!  Based upon your presentation it appears you have a yeast/fungal infection.  I have prescribed: and Nystatin cream apply to the affected area twice daily for 2 weeks. I also want you to schedule a follow-up with your primary care provider within next few weeks giving these ongoing symptoms.   You should also try to limit shaving (really recommend that you forego until this resolves), making sure to use a new razor the next time you are shaving after rash has cleared. Keep some baby powder in the area to help wick moisture. I would switch to a natural, unscented and non-dyed deodorant as well to prevent further irritation of the skin.    HOME CARE:  Take cool showers and avoid direct sunlight. Apply cool compress or wet dressings. Take a bath in an oatmeal bath.  Sprinkle content of one Aveeno packet under running faucet with comfortably warm water.  Bathe for 15-20 minutes, 1-2 times daily.  Pat dry with a towel. Do not rub the rash. Use hydrocortisone cream. Take an antihistamine like Benadryl for widespread rashes that itch.  The adult dose of Benadryl is 25-50 mg by mouth 4 times daily. Caution:  This type of medication may cause sleepiness.  Do not drink alcohol, drive, or operate dangerous machinery while taking antihistamines.  Do not take these medications if you have prostate enlargement.  Read package instructions thoroughly on all medications that you take.  GET HELP RIGHT AWAY IF:  Symptoms don't go away after treatment. Severe itching that persists. If you rash spreads or swells. If you rash begins to smell. If it blisters and opens or develops a yellow-brown crust. You develop a fever. You have a sore throat. You become short of breath.  MAKE SURE YOU:  Understand these instructions. Will watch your condition. Will get help right away if you are not doing well or get  worse.  Thank you for choosing an e-visit.  Your e-visit answers were reviewed by a board certified advanced clinical practitioner to complete your personal care plan. Depending upon the condition, your plan could have included both over the counter or prescription medications.  Please review your pharmacy choice. Make sure the pharmacy is open so you can pick up prescription now. If there is a problem, you may contact your provider through [employer] and have the prescription routed to another pharmacy.  Your safety is important to [place]. If you have drug allergies check your prescription carefully.   For the next 24 hours you can use MyChart to ask questions about today's visit, request a non-urgent call back, or ask for a work or school excuse. You will get an email in the next two days asking about your experience. I hope that your e-visit has been valuable and will speed your recovery.

## 2020-10-08 NOTE — Progress Notes (Signed)
I have spent 5 minutes in review of e-visit questionnaire, review and updating patient chart, medical decision making and response to patient.   Maygen Sirico Cody Elisandro Jarrett, PA-C    

## 2020-12-07 ENCOUNTER — Other Ambulatory Visit: Payer: Self-pay | Admitting: Physician Assistant

## 2020-12-08 ENCOUNTER — Ambulatory Visit (INDEPENDENT_AMBULATORY_CARE_PROVIDER_SITE_OTHER): Payer: No Typology Code available for payment source | Admitting: Physician Assistant

## 2020-12-08 ENCOUNTER — Encounter: Payer: Self-pay | Admitting: Physician Assistant

## 2020-12-08 ENCOUNTER — Other Ambulatory Visit: Payer: Self-pay

## 2020-12-08 DIAGNOSIS — I10 Essential (primary) hypertension: Secondary | ICD-10-CM | POA: Diagnosis not present

## 2020-12-08 DIAGNOSIS — F430 Acute stress reaction: Secondary | ICD-10-CM | POA: Diagnosis not present

## 2020-12-08 DIAGNOSIS — B372 Candidiasis of skin and nail: Secondary | ICD-10-CM | POA: Diagnosis not present

## 2020-12-08 DIAGNOSIS — F410 Panic disorder [episodic paroxysmal anxiety] without agoraphobia: Secondary | ICD-10-CM

## 2020-12-08 MED ORDER — LOSARTAN POTASSIUM 50 MG PO TABS: ORAL_TABLET | ORAL | 1 refills | Status: DC

## 2020-12-08 MED ORDER — AMLODIPINE BESYLATE 10 MG PO TABS: 10.0000 mg | ORAL_TABLET | Freq: Every day | ORAL | 1 refills | Status: DC

## 2020-12-08 MED ORDER — NYSTATIN 100000 UNIT/GM EX OINT: 1.0000 "application " | TOPICAL_OINTMENT | Freq: Two times a day (BID) | CUTANEOUS | 0 refills | Status: DC

## 2020-12-08 MED ORDER — ALPRAZOLAM 0.25 MG PO TABS: 0.2500 mg | ORAL_TABLET | Freq: Two times a day (BID) | ORAL | 0 refills | Status: DC | PRN

## 2020-12-08 NOTE — Progress Notes (Signed)
Michele Cunningham is a 35 y.o. female here for a follow up of hypertension  History of Present Illness:   Chief Complaint  Patient presents with   Hypertension    HPI  HTN Michele Cunningham has been compliant with taking norvasc 10 mg and valsartan 80 mg daily with no adverse effects.  At home BP is not checked as often. Patient denies chest pain, SOB, blurred vision, dizziness, unusual headaches, lower leg swelling. Denies excessive caffeine intake, stimulant usage, excessive alcohol intake, or increase in salt consumption. Due to high cost of valsartan she is interested in trialing an additional medication. She is managing well.   BP Readings from Last 3 Encounters:  12/08/20 130/82  09/02/20 (!) 150/90  06/20/20 (!) 156/102   Anxiety Currently compliant with taking xanax 0.25 mg as needed with no adverse effects. States that although her life is a bit stressful right now, she is certain it will get better. She is managing well at this time.   Recurring Yeast Infection of the skin Michele Cunningham was previously seen about this issue by PA Michele Cunningham on 10/08/20 by via E visit. During this visit she was noticing a rash on her legs and arms that wouldn't go away, especially worse under armpits. It appeared to be red and itchy. Following the visit she found that the nystatin ointment prescribed to her was very helpful. At this time she would like to continue to have this ointment on hand in case she gets another rash.   Past Medical History:  Diagnosis Date   Anxiety    Hypertension      Social History   Tobacco Use   Smoking status: Never   Smokeless tobacco: Never  Vaping Use   Vaping Use: Never used  Substance Use Topics   Alcohol use: Yes    Alcohol/week: 3.0 - 4.0 standard drinks    Types: 3 - 4 Glasses of wine per week   Drug use: Never    History reviewed. No pertinent surgical history.  Family History  Problem Relation Age of Onset   Cancer Mother        skin   Hypertension Mother     Hypertension Sister     No Known Allergies  Current Medications:   Current Outpatient Medications:    ALPRAZolam (XANAX) 0.25 MG tablet, Take 1 tablet (0.25 mg total) by mouth 2 (two) times daily as needed for anxiety., Disp: 20 tablet, Rfl: 0   amLODipine (NORVASC) 10 MG tablet, Take 1 tablet (10 mg total) by mouth daily., Disp: 90 tablet, Rfl: 1   losartan (COZAAR) 50 MG tablet, TAKE 1 TABLET(50 MG) BY MOUTH DAILY, Disp: 90 tablet, Rfl: 0   nystatin ointment (MYCOSTATIN), Apply 1 application topically 2 (two) times daily., Disp: 30 g, Rfl: 0   Review of Systems:   ROS Negative unless otherwise specified per HPI. Vitals:   Vitals:   12/08/20 1527  BP: 130/82  Pulse: 79  Temp: (!) 97.3 F (36.3 C)  TempSrc: Temporal  SpO2: 97%  Weight: 192 lb 6.1 oz (87.3 kg)  Height: 5' 7.25" (1.708 m)     Body mass index is 29.91 kg/m.  Physical Exam:   Physical Exam Vitals and nursing note reviewed.  Constitutional:      General: She is not in acute distress.    Appearance: She is well-developed. She is not ill-appearing or toxic-appearing.  Cardiovascular:     Rate and Rhythm: Normal rate and regular rhythm.  Pulses: Normal pulses.     Heart sounds: Normal heart sounds, S1 normal and S2 normal.  Pulmonary:     Effort: Pulmonary effort is normal.     Breath sounds: Normal breath sounds.  Skin:    General: Skin is warm and dry.  Neurological:     Mental Status: She is alert.     GCS: GCS eye subscore is 4. GCS verbal subscore is 5. GCS motor subscore is 6.  Psychiatric:        Speech: Speech normal.        Behavior: Behavior normal. Behavior is cooperative.    Assessment and Plan:   Panic attack as reaction to stress Overall controlled Denies SI/HI Continue xanax 0.25 mg as needed  Yeast infection of the skin Refill nystatin ointment today; use as needed  Essential hypertension Controlled Continue norvasc 10 mg and losartan 50 mg daily.  Monitor BP at home  regularly Follow-up for CPE anytime after mid-January 2023  I,Michele Cunningham,acting as a scribe for [employer], PA.,have documented all relevant documentation on the behalf of Michele Motto, PA,as directed by  Michele Motto, PA while in the presence of Michele Cunningham, [place].  I, Michele Cunningham, [place], have reviewed all documentation for this visit. The documentation on 12/08/20 for the exam, diagnosis, procedures, and orders are all accurate and complete.   Michele Motto, PA-C

## 2020-12-08 NOTE — Patient Instructions (Signed)
It was great to see you!  We will repeat your blood work today  Blood pressure medications, xanax and ointment refilled  Let's follow-up anytime after January for a physical  Take care,  Jarold Motto PA-C

## 2020-12-09 LAB — COMPREHENSIVE METABOLIC PANEL
ALT: 33 U/L (ref 0–35)
AST: 36 U/L (ref 0–37)
Albumin: 5 g/dL (ref 3.5–5.2)
Alkaline Phosphatase: 42 U/L (ref 39–117)
BUN: 12 mg/dL (ref 6–23)
CO2: 25 mEq/L (ref 19–32)
Calcium: 10.2 mg/dL (ref 8.4–10.5)
Chloride: 101 mEq/L (ref 96–112)
Creatinine, Ser: 0.8 mg/dL (ref 0.40–1.20)
GFR: 94.29 mL/min (ref >60.00)
Glucose, Bld: 92 mg/dL (ref 70–99)
Potassium: 3.4 mEq/L — ABNORMAL LOW (ref 3.5–5.1)
Sodium: 136 mEq/L (ref 135–145)
Total Bilirubin: 0.5 mg/dL (ref 0.2–1.2)
Total Protein: 7.9 g/dL (ref 6.0–8.3)

## 2020-12-10 ENCOUNTER — Telehealth: Payer: No Typology Code available for payment source | Admitting: Physician Assistant

## 2020-12-10 DIAGNOSIS — R300 Dysuria: Secondary | ICD-10-CM | POA: Diagnosis not present

## 2020-12-10 MED ORDER — CEPHALEXIN 500 MG PO CAPS: 500.0000 mg | ORAL_CAPSULE | Freq: Two times a day (BID) | ORAL | 0 refills | Status: AC

## 2020-12-10 NOTE — Progress Notes (Signed)
I have spent 5 minutes in review of e-visit questionnaire, review and updating patient chart, medical decision making and response to patient.   Kassity Woodson Cody Iwao Shamblin, PA-C    

## 2020-12-10 NOTE — Progress Notes (Signed)

## 2020-12-10 NOTE — Progress Notes (Signed)
I have spent 5 minutes in review of e-visit questionnaire, review and updating patient chart, medical decision making and response to patient.   Mardy Lucier Cody Chelle [place]ton, PA-C    

## 2021-03-05 ENCOUNTER — Telehealth: Payer: No Typology Code available for payment source | Admitting: Physician Assistant

## 2021-03-05 DIAGNOSIS — L304 Erythema intertrigo: Secondary | ICD-10-CM

## 2021-03-05 MED ORDER — NYSTATIN 100000 UNIT/GM EX CREA: 1.0000 "application " | TOPICAL_CREAM | Freq: Two times a day (BID) | CUTANEOUS | 0 refills | Status: AC

## 2021-03-05 MED ORDER — FLUCONAZOLE 150 MG PO TABS
150.0000 mg | ORAL_TABLET | ORAL | 0 refills | Status: DC
Start: 2021-03-05 — End: 2022-08-31

## 2021-03-05 NOTE — Progress Notes (Signed)
E Visit for Rash  We are sorry that you are not feeling well. Here is how we plan to help!  Based upon your presentation it appears you have a fungal infection.  I have prescribed: and Nystatin cream apply to the affected area twice daily. I have also prescribed Diflucan. Take one tablet once weekly x 4 weeks.   HOME CARE:  Take cool showers and avoid direct sunlight. Apply cool compress or wet dressings. Take a bath in an oatmeal bath.  Sprinkle content of one Aveeno packet under running faucet with comfortably warm water.  Bathe for 15-20 minutes, 1-2 times daily.  Pat dry with a towel. Do not rub the rash. Use hydrocortisone cream. Take an antihistamine like Benadryl for widespread rashes that itch.  The adult dose of Benadryl is 25-50 mg by mouth 4 times daily. Caution:  This type of medication may cause sleepiness.  Do not drink alcohol, drive, or operate dangerous machinery while taking antihistamines.  Do not take these medications if you have prostate enlargement.  Read package instructions thoroughly on all medications that you take.  GET HELP RIGHT AWAY IF:  Symptoms don't go away after treatment. Severe itching that persists. If you rash spreads or swells. If you rash begins to smell. If it blisters and opens or develops a yellow-brown crust. You develop a fever. You have a sore throat. You become short of breath.  MAKE SURE YOU:  Understand these instructions. Will watch your condition. Will get help right away if you are not doing well or get worse.  Thank you for choosing an e-visit.  Your e-visit answers were reviewed by a board certified advanced clinical practitioner to complete your personal care plan. Depending upon the condition, your plan could have included both over the counter or prescription medications.  Please review your pharmacy choice. Make sure the pharmacy is open so you can pick up prescription now. If there is a problem, you may contact your  provider through [employer] and have the prescription routed to another pharmacy.  Your safety is important to [place]. If you have drug allergies check your prescription carefully.   For the next 24 hours you can use MyChart to ask questions about today's visit, request a non-urgent call back, or ask for a work or school excuse. You will get an email in the next two days asking about your experience. I hope that your e-visit has been valuable and will speed your recovery.  I provided 5 minutes of non face-to-face time during this encounter for chart review and documentation.

## 2021-03-11 ENCOUNTER — Other Ambulatory Visit: Payer: Self-pay | Admitting: Physician Assistant

## 2021-03-11 DIAGNOSIS — I10 Essential (primary) hypertension: Secondary | ICD-10-CM

## 2021-03-31 ENCOUNTER — Encounter: Payer: Self-pay | Admitting: Physician Assistant

## 2021-03-31 ENCOUNTER — Ambulatory Visit (INDEPENDENT_AMBULATORY_CARE_PROVIDER_SITE_OTHER): Payer: No Typology Code available for payment source | Admitting: Physician Assistant

## 2021-03-31 ENCOUNTER — Other Ambulatory Visit (HOSPITAL_COMMUNITY)
Admission: RE | Admit: 2021-03-31 | Discharge: 2021-03-31 | Disposition: A | Discharge status: Home / Self Care | Payer: No Typology Code available for payment source | Source: Ambulatory Visit | Attending: Physician Assistant | Admitting: Physician Assistant

## 2021-03-31 VITALS — BP 140/84 | HR 118 | Temp 98.6°F | Ht 67.25 in | Wt 195.6 lb

## 2021-03-31 DIAGNOSIS — Z0000 Encounter for general adult medical examination without abnormal findings: Secondary | ICD-10-CM

## 2021-03-31 DIAGNOSIS — R21 Rash and other nonspecific skin eruption: Secondary | ICD-10-CM

## 2021-03-31 DIAGNOSIS — I10 Essential (primary) hypertension: Secondary | ICD-10-CM

## 2021-03-31 DIAGNOSIS — E785 Hyperlipidemia, unspecified: Secondary | ICD-10-CM

## 2021-03-31 DIAGNOSIS — E669 Obesity, unspecified: Secondary | ICD-10-CM

## 2021-03-31 DIAGNOSIS — Z124 Encounter for screening for malignant neoplasm of cervix: Secondary | ICD-10-CM

## 2021-03-31 MED ORDER — TRIAMCINOLONE ACETONIDE 0.1 % EX CREA: TOPICAL_CREAM | CUTANEOUS | 0 refills | Status: AC

## 2021-03-31 NOTE — Patient Instructions (Addendum)
It was great to see you! ? ?May use over the counter antihistamines such as Zyrtec (cetirizine), Claritin (loratadine), Allegra (fexofenadine), or Xyzal (levocetirizine) daily. ? ?Dermatology referral placed ? ?Trial the triamcinolone ointment you can also try to mix it with the nystatin ? ?Follow-up in 6 months for blood work. ? ?Take care, ? ?Lelon Mast ?  ? ?

## 2021-03-31 NOTE — Progress Notes (Signed)
? ?Subjective:  ?  ?Michele Cunningham is a 36 y.o. female and is here for a comprehensive physical exam. ? ?HPI ? ?There are no preventive care reminders to display for this patient. ? ?Acute Concerns: ?None ? ?Chronic Issues: ?HTN ?Currently compliant with taking norvasc 10 mg daily and losartan 50 mg daily with no complications. At home blood pressure readings are: not checked. Patient denies chest pain, SOB, blurred vision, dizziness, unusual headaches, lower leg swelling. Patient is compliant with medication. Denies excessive caffeine intake, stimulant usage, excessive alcohol intake, or increase in salt consumption. ? ?BP Readings from Last 3 Encounters:  ?03/31/21 140/84  ?12/08/20 130/82  ?09/02/20 (!) 150/90  ? ?Anxiety/Depression ?Michele Cunningham is currently compliant with taking xanax 0.25 mg daily as needed with no adverse effects. Pt expresses that this medication was very beneficial when she was going through her divorce. Since the finalization of said divorce, she hasn't had to take this medication.  At this time she is tolerating well. Denies SI/HI.  ? ?Recurring Yeast Infection of the Skin  ?Since our prior visit, pt is still experiencing a rash on her legs, arms, and armpits. The rash still appears to be red and itchy. She has remained compliant with the diflucan 150 mg once weekly x 4 weeks and applying the nystatin cream to affected areas but hasn't seen any improvement. Feels she had this going on prior to starting the losartan and norvasc medications. ? ?Health Maintenance: ?Immunizations -- Covid- UTD ?Influenza- Declined ?Tdap- Declined ?PAP -- 03/31/21 ?Bone Density -- N/A ?Dentistry- UTD ?Ophthalmology- Will update soon ?Diet -- Eats all food groups  ?Sleep habits -- No concerns  ?Exercise -- As able; looking forward to going to the gym regularly with partner ?Current Weight -- Stable  ?Weight History: ?Wt Readings from Last 10 Encounters:  ?03/31/21 195 lb 9.6 oz (88.7 kg)  ?12/08/20 192 lb 6.1 oz (87.3  kg)  ?09/02/20 192 lb (87.1 kg)  ?01/29/20 190 lb (86.2 kg)  ?08/03/19 187 lb (84.8 kg)  ?02/19/19 187 lb 9.6 oz (85.1 kg)  ?08/21/18 185 lb (83.9 kg)  ?05/19/18 187 lb 6.4 oz (85 kg)  ?03/31/18 182 lb (82.6 kg)  ?03/08/18 180 lb 3.2 oz (81.7 kg)  ? ?Body mass index is 30.41 kg/m?. ?Mood -- Stable ? ?No LMP recorded. ?Period characteristics -- Normal ?Birth control -- None reported  ? ? ? reports current alcohol use of about 3.0 - 4.0 standard drinks per week.  ?Tobacco Use: Low Risk   ? Smoking Tobacco Use: Never  ? Smokeless Tobacco Use: Never  ? Passive Exposure: Not on file  ? ? ? ?Depression screen [facility] 2/9 09/02/2020  ?Decreased Interest 0  ?Down, Depressed, Hopeless 0  ?PHQ - 2 Score 0  ? ? ? ?Other providers/specialists: ?Patient Care Team: ?Inda Coke, PA as PCP - General (Physician Assistant)  ? ?PMHx, SurgHx, SocialHx, Medications, and Allergies were reviewed in the Visit Navigator and updated as appropriate.  ? ?Past Medical History:  ?Diagnosis Date  ? Anxiety   ? Hypertension   ? ? ?History reviewed. No pertinent surgical history. ? ? ?Family History  ?Problem Relation Age of Onset  ? Cancer Mother   ?     skin  ? Hypertension Mother   ? Hypertension Sister   ? Breast cancer Neg Hx   ? Colon cancer Neg Hx   ? ? ?Social History  ? ?Tobacco Use  ? Smoking status: Never  ? Smokeless tobacco: Never  ?  Vaping Use  ? Vaping Use: Never used  ?Substance Use Topics  ? Alcohol use: Yes  ?  Alcohol/week: 3.0 - 4.0 standard drinks  ?  Types: 3 - 4 Glasses of wine per week  ? Drug use: Never  ? ? ?Review of Systems:  ? ?Review of Systems  ?Constitutional:  Negative for chills, fever, malaise/fatigue and weight loss.  ?HENT:  Negative for hearing loss, sinus pain and sore throat.   ?Respiratory:  Negative for cough and hemoptysis.   ?Cardiovascular:  Negative for chest pain, palpitations, leg swelling and PND.  ?Gastrointestinal:  Negative for abdominal pain, constipation, diarrhea, heartburn, nausea and  vomiting.  ?Genitourinary:  Negative for dysuria, frequency and urgency.  ?Musculoskeletal:  Negative for back pain, myalgias and neck pain.  ?Skin:  Negative for itching and rash.  ?Neurological:  Negative for dizziness, tingling, seizures and headaches.  ?Endo/Heme/Allergies:  Negative for polydipsia.  ?Psychiatric/Behavioral:  Negative for depression. The patient is not nervous/anxious.   ? ?Objective:  ? ?BP 140/84   Pulse (!) 118   Temp 98.6 ?F (37 ?C)   Ht 5' 7.25" (1.708 m)   Wt 195 lb 9.6 oz (88.7 kg)   SpO2 99%   BMI 30.41 kg/m?  ? ?General Appearance:    Alert, cooperative, no distress, appears stated age  ?Head:    Normocephalic, without obvious abnormality, atraumatic  ?Eyes:    PERRL, conjunctiva/corneas clear, EOM's intact, fundi  ?  benign, both eyes  ?Ears:    Normal TM's and external ear canals, both ears  ?Nose:   Nares normal, septum midline, mucosa normal, no drainage    or sinus tenderness  ?Throat:   Lips, mucosa, and tongue normal; teeth and gums normal  ?Neck:   Supple, symmetrical, trachea midline, no adenopathy;  ?  thyroid:  no enlargement/tenderness/nodules; no carotid ?  bruit or JVD  ?Back:     Symmetric, no curvature, ROM normal, no CVA tenderness  ?Lungs:     Clear to auscultation bilaterally, respirations unlabored  ?Chest Wall:    No tenderness or deformity  ? Heart:    Regular rate and rhythm, S1 and S2 normal, no murmur, rub   or gallop  ?Breast Exam:    Deferred  ?Abdomen:     Soft, non-tender, bowel sounds active all four quadrants,  ?  no masses, no organomegaly  ?Genitalia:    Normal female without lesion, discharge or tenderness  ?Rectal:    Normal tone no masses or tenderness  ?Extremities:   Extremities normal, atraumatic, no cyanosis or edema  ?Pulses:   2+ and symmetric all extremities  ?Skin:   Skin color, texture, turgor normal ?Well demarcated areas of erythema to bilateral axillary areas; no open lesions or drainage  ?Lymph nodes:   Cervical, supraclavicular,  and axillary nodes normal  ?Neurologic:   CNII-XII intact, normal strength, sensation and reflexes  ?  throughout  ? ? ?Assessment/Plan:  ? ?Routine physical examination ?Today patient counseled on age appropriate routine health concerns for screening and prevention, each reviewed and up to date or declined. Immunizations reviewed and up to date or declined. Labs ordered and reviewed. Risk factors for depression reviewed and negative. Hearing function and visual acuity are intact. ADLs screened and addressed as needed. Functional ability and level of safety reviewed and appropriate. Education, counseling and referrals performed based on assessed risks today. Patient provided with a copy of personalized plan for preventive services. ? ?She would like to defer labwork today ? ?  Essential hypertension ?Controlled ?Continue losartan 50 mg daily and norvasc 10 mg daily  ?Monitor BP regularly 1-2 times a week ?Encouraged patient to continue participating in healthy eating and daily exercise ?I advised patient that if BP readings are consistently >150/100, to reach out to office and medication will be adjusted ?I did discuss that these medications are not safe for pregnancy should she try to conceive in the future ?Follow up in 6 months, sooner if concerns ? ?Screening for malignant neoplasm of cervix ?Completed today ? ?Hyperlipidemia, unspecified hyperlipidemia type ?Update lipid panel/profile in 6 months, will start medication as indicated by results  ? ? ?Patient Counseling: ? ? ?[x]    ? Nutrition: Stressed importance of moderation in sodium/caffeine intake, saturated fat and cholesterol, caloric balance, sufficient intake of fresh fruits, vegetables, fiber, calcium, iron, and 1 mg of folate supplement per day (for females capable of pregnancy).  ? ?[x]    ?  ?Stressed the importance of regular exercise.   ? ?[x]    ? Substance Abuse: Discussed cessation/primary prevention of tobacco, alcohol, or other drug use; driving  or other dangerous activities under the influence; availability of treatment for abuse.   ? ?[x]      Injury prevention: Discussed safety belts, safety helmets, smoke detector, smoking near bedding or upholstery.   ? ?

## 2021-04-02 LAB — CYTOLOGY - PAP
Adequacy: ABSENT
Comment: NEGATIVE
Diagnosis: NEGATIVE
High risk HPV: NEGATIVE

## 2021-04-12 ENCOUNTER — Telehealth: Payer: No Typology Code available for payment source | Admitting: Family

## 2021-04-12 DIAGNOSIS — J069 Acute upper respiratory infection, unspecified: Secondary | ICD-10-CM | POA: Diagnosis not present

## 2021-04-12 MED ORDER — FLUTICASONE PROPIONATE 50 MCG/ACT NA SUSP: 2.0000 | Freq: Every day | NASAL | 6 refills | Status: DC

## 2021-04-12 MED ORDER — BENZONATATE 100 MG PO CAPS: 100.0000 mg | ORAL_CAPSULE | Freq: Three times a day (TID) | ORAL | 0 refills | Status: DC | PRN

## 2021-04-12 NOTE — Progress Notes (Signed)

## 2021-06-02 ENCOUNTER — Telehealth: Payer: No Typology Code available for payment source | Admitting: Nurse Practitioner

## 2021-06-02 DIAGNOSIS — L249 Irritant contact dermatitis, unspecified cause: Secondary | ICD-10-CM

## 2021-06-02 MED ORDER — PREDNISONE 10 MG PO TABS: ORAL_TABLET | ORAL | 0 refills | Status: DC

## 2021-06-02 NOTE — Progress Notes (Signed)
E Visit for Rash  We are sorry that you are not feeling well. Here is how we plan to help!  Since you have already been using a topical steroid cream for 2 weeks you should stop that and we will start oral steroids. You can use a soothing cream like Aquaphor topical to the area, or benadryl cream if it is itchy.   I am prescribing a two week course of steroids (37 tablets of 10 mg prednisone).  Days 1-4 take 4 tablets (40 mg) daily  Days 5-8 take 3 tablets (30 mg) daily, Days 9-11 take 2 tablets (20 mg) daily, Days 12-14 take 1 tablet (10 mg) daily.   Be sure to take each dose of oral steroids with food.  You can continue oral allergy medicine as well, Claritin 10mg  once daily   HOME CARE:  Take cool showers and avoid direct sunlight. Apply cool compress or wet dressings. Take a bath in an oatmeal bath.  Sprinkle content of one Aveeno packet under running faucet with comfortably warm water.  Bathe for 15-20 minutes, 1-2 times daily.  Pat dry with a towel. Do not rub the rash. Use hydrocortisone cream. Take an antihistamine like Benadryl for widespread rashes that itch.  The adult dose of Benadryl is 25-50 mg by mouth 4 times daily. Caution:  This type of medication may cause sleepiness.  Do not drink alcohol, drive, or operate dangerous machinery while taking antihistamines.  Do not take these medications if you have prostate enlargement.  Read package instructions thoroughly on all medications that you take.  GET HELP RIGHT AWAY IF:  Symptoms don't go away after treatment. Severe itching that persists. If you rash spreads or swells. If you rash begins to smell. If it blisters and opens or develops a yellow-brown crust. You develop a fever. You have a sore throat. You become short of breath.  MAKE SURE YOU:  Understand these instructions. Will watch your condition. Will get help right away if you are not doing well or get worse.  Thank you for choosing an e-visit.  Your e-visit  answers were reviewed by a board certified advanced clinical practitioner to complete your personal care plan. Depending upon the condition, your plan could have included both over the counter or prescription medications.  Please review your pharmacy choice. Make sure the pharmacy is open so you can pick up prescription now. If there is a problem, you may contact your provider through and have the prescription routed to another pharmacy.  Your safety is important to [employer]. If you have drug allergies check your prescription carefully.   For the next 24 hours you can use MyChart to ask questions about today's visit, request a non-urgent call back, or ask for a work or school excuse. You will get an email in the next two days asking about your experience. I hope that your e-visit has been valuable and will speed your recovery.   Meds ordered this encounter  Medications   predniSONE (DELTASONE) 10 MG tablet    Sig: Take 4 tablets (40mg ) on days 1-4, then 3 tablets (30mg ) on days 5-8, then 2 tablets (20mg ) on days 9-11, then 1 tablet daily for days 12-14. Take with food.    Dispense:  37 tablet    Refill:  0     I spent approximately 7 minutes reviewing the patient's history, current symptoms and coordinating their plan of care today.

## 2021-08-02 ENCOUNTER — Other Ambulatory Visit: Payer: Self-pay | Admitting: Physician Assistant

## 2021-08-02 DIAGNOSIS — L304 Erythema intertrigo: Secondary | ICD-10-CM

## 2021-08-03 ENCOUNTER — Other Ambulatory Visit: Payer: Self-pay | Admitting: Physician Assistant

## 2021-08-03 DIAGNOSIS — L304 Erythema intertrigo: Secondary | ICD-10-CM

## 2021-08-03 NOTE — Telephone Encounter (Signed)
Last prescribed by Margaretann Loveless, PA-C

## 2021-10-02 ENCOUNTER — Ambulatory Visit: Payer: No Typology Code available for payment source | Admitting: Physician Assistant

## 2021-10-06 ENCOUNTER — Ambulatory Visit: Payer: No Typology Code available for payment source | Admitting: Physician Assistant

## 2021-10-15 DIAGNOSIS — L728 Other follicular cysts of the skin and subcutaneous tissue: Secondary | ICD-10-CM | POA: Diagnosis not present

## 2021-10-15 DIAGNOSIS — L308 Other specified dermatitis: Secondary | ICD-10-CM | POA: Diagnosis not present

## 2021-10-15 DIAGNOSIS — L304 Erythema intertrigo: Secondary | ICD-10-CM | POA: Diagnosis not present

## 2021-11-16 ENCOUNTER — Telehealth: Payer: No Typology Code available for payment source | Admitting: Physician Assistant

## 2021-11-16 DIAGNOSIS — B9689 Other specified bacterial agents as the cause of diseases classified elsewhere: Secondary | ICD-10-CM

## 2021-11-16 DIAGNOSIS — J019 Acute sinusitis, unspecified: Secondary | ICD-10-CM

## 2021-11-16 MED ORDER — AMOXICILLIN-POT CLAVULANATE 875-125 MG PO TABS: 1.0000 | ORAL_TABLET | Freq: Two times a day (BID) | ORAL | 0 refills | Status: DC

## 2021-11-16 NOTE — Progress Notes (Signed)

## 2022-01-26 ENCOUNTER — Other Ambulatory Visit: Payer: Self-pay | Admitting: Physician Assistant

## 2022-01-26 DIAGNOSIS — I10 Essential (primary) hypertension: Secondary | ICD-10-CM

## 2022-02-10 ENCOUNTER — Other Ambulatory Visit: Payer: Self-pay | Admitting: Physician Assistant

## 2022-04-04 ENCOUNTER — Other Ambulatory Visit: Payer: Self-pay | Admitting: Physician Assistant

## 2022-05-09 ENCOUNTER — Other Ambulatory Visit: Payer: Self-pay | Admitting: Physician Assistant

## 2022-05-09 DIAGNOSIS — I10 Essential (primary) hypertension: Secondary | ICD-10-CM

## 2022-08-16 ENCOUNTER — Other Ambulatory Visit: Payer: Self-pay | Admitting: Physician Assistant

## 2022-08-16 DIAGNOSIS — I10 Essential (primary) hypertension: Secondary | ICD-10-CM

## 2022-08-31 ENCOUNTER — Ambulatory Visit (INDEPENDENT_AMBULATORY_CARE_PROVIDER_SITE_OTHER): Payer: BLUE CROSS/BLUE SHIELD | Admitting: Physician Assistant

## 2022-08-31 ENCOUNTER — Encounter: Payer: Self-pay | Admitting: Physician Assistant

## 2022-08-31 DIAGNOSIS — F430 Acute stress reaction: Secondary | ICD-10-CM | POA: Diagnosis not present

## 2022-08-31 DIAGNOSIS — F410 Panic disorder [episodic paroxysmal anxiety] without agoraphobia: Secondary | ICD-10-CM

## 2022-08-31 DIAGNOSIS — I10 Essential (primary) hypertension: Secondary | ICD-10-CM | POA: Diagnosis not present

## 2022-08-31 MED ORDER — ALPRAZOLAM 0.25 MG PO TABS: 0.2500 mg | ORAL_TABLET | Freq: Two times a day (BID) | ORAL | 0 refills | Status: DC | PRN

## 2022-08-31 MED ORDER — LOSARTAN POTASSIUM 50 MG PO TABS: ORAL_TABLET | ORAL | 3 refills | Status: DC

## 2022-08-31 MED ORDER — AMLODIPINE BESYLATE 10 MG PO TABS: 10.0000 mg | ORAL_TABLET | Freq: Every day | ORAL | 3 refills | Status: DC

## 2022-08-31 NOTE — Progress Notes (Signed)
Michele Cunningham is a 37 y.o. female here for a follow up of a pre-existing problem.  History of Present Illness:   Chief Complaint  Patient presents with   Medication Refill   Hypertension   Anxiety    HPI  Hypertension: Compliant with 10 mg Amlodipine and 50 mg Losartan, but has been out of Losartan for past week.  Reports she has not been monitoring blood pressure.  Denies chest pain, shortness of breath, lower extremity swelling BP Readings from Last 3 Encounters:  08/31/22 (!) 144/90  03/31/21 140/84  12/08/20 130/82   Anxiety: Managed with 0.25 Xanax. No concerns for needing increased dosage.  Needs refill Denies SI/HI  Past Medical History:  Diagnosis Date   Anxiety    Hypertension      Social History   Tobacco Use   Smoking status: Never   Smokeless tobacco: Never  Vaping Use   Vaping status: Never Used  Substance Use Topics   Alcohol use: Yes    Alcohol/week: 3.0 - 4.0 standard drinks of alcohol    Types: 3 - 4 Glasses of wine per week   Drug use: Never    No past surgical history on file.  Family History  Problem Relation Age of Onset   Cancer Mother        skin   Hypertension Mother    Hypertension Sister    Breast cancer Neg Hx    Colon cancer Neg Hx     No Known Allergies  Current Medications:   Current Outpatient Medications:    ALPRAZolam (XANAX) 0.25 MG tablet, Take 1 tablet (0.25 mg total) by mouth 2 (two) times daily as needed for anxiety., Disp: 20 tablet, Rfl: 0   amLODipine (NORVASC) 10 MG tablet, TAKE 1 TABLET(10 MG) BY MOUTH DAILY, Disp: 90 tablet, Rfl: 0   losartan (COZAAR) 50 MG tablet, TAKE 1 TABLET(50 MG) BY MOUTH DAILY, Disp: 30 tablet, Rfl: 0   nystatin cream (MYCOSTATIN), Apply 1 application topically 2 (two) times daily., Disp: 30 g, Rfl: 0   triamcinolone cream (KENALOG) 0.1 %, Apply to affected area 1-2 times daily, Disp: 30 g, Rfl: 0   Review of Systems:   ROS Negative unless otherwise specified per  HPI.  Vitals:   Vitals:   08/31/22 1255  BP: (!) 144/90  Pulse: 88  Temp: 98.2 F (36.8 C)  TempSrc: Temporal  SpO2: 97%  Weight: 191 lb 4 oz (86.8 kg)  Height: 5' 7.25" (1.708 m)     Body mass index is 29.73 kg/m.  Physical Exam:   Physical Exam Vitals and nursing note reviewed.  Constitutional:      General: She is not in acute distress.    Appearance: She is well-developed. She is not ill-appearing or toxic-appearing.  Cardiovascular:     Rate and Rhythm: Normal rate and regular rhythm.     Pulses: Normal pulses.     Heart sounds: Normal heart sounds, S1 normal and S2 normal.  Pulmonary:     Effort: Pulmonary effort is normal.     Breath sounds: Normal breath sounds.  Skin:    General: Skin is warm and dry.  Neurological:     Mental Status: She is alert.     GCS: GCS eye subscore is 4. GCS verbal subscore is 5. GCS motor subscore is 6.  Psychiatric:        Speech: Speech normal.        Behavior: Behavior normal. Behavior is cooperative.  Assessment and Plan:   Essential hypertension Above goal today No evidence of end-organ damage on my exam Recommend patient monitor home blood pressure at least a few times weekly Continue 10 mg Amlodipine and 50 mg Losartan mg daily If home monitoring shows consistent elevation, or any symptom(s) develop, recommend reach out to [place] for further advice on next steps I have asked her to return for blood work within the month to update lytes/renal profile (future orders placed)  Panic attack as reaction to stress Overall controlled Denies need for daily medication Continue as needed xanax -- prescription provided Prescription drug monitoring program reviewed with no red flags today   I,Emily Lagle,acting as a scribe for [employer], PA.,have documented all relevant documentation on the behalf of Jarold Motto, PA,as directed by  Jarold Motto, PA while in the presence of Jarold Motto, [place].  I, Jarold Motto,  [place], have reviewed all documentation for this visit. The documentation on 08/31/22 for the exam, diagnosis, procedures, and orders are all accurate and complete.  Jarold Motto, PA-C

## 2022-08-31 NOTE — Patient Instructions (Signed)
It was great to see you!  Please make an appointment with the lab on your way out. I would like for you to return for lab work within 1 month. You will NOT need to be fasting for this.  Let's follow-up in 6 months, sooner if you have concerns.  Take care,  Jarold Motto PA-C

## 2022-09-28 ENCOUNTER — Other Ambulatory Visit (INDEPENDENT_AMBULATORY_CARE_PROVIDER_SITE_OTHER): Payer: BLUE CROSS/BLUE SHIELD

## 2022-09-28 ENCOUNTER — Other Ambulatory Visit: Payer: Self-pay | Admitting: *Deleted

## 2022-09-28 DIAGNOSIS — E785 Hyperlipidemia, unspecified: Secondary | ICD-10-CM | POA: Diagnosis not present

## 2022-09-28 DIAGNOSIS — I10 Essential (primary) hypertension: Secondary | ICD-10-CM

## 2022-12-01 ENCOUNTER — Ambulatory Visit (INDEPENDENT_AMBULATORY_CARE_PROVIDER_SITE_OTHER): Payer: BLUE CROSS/BLUE SHIELD

## 2022-12-01 DIAGNOSIS — Z23 Encounter for immunization: Secondary | ICD-10-CM

## 2022-12-01 NOTE — Progress Notes (Signed)
Patient is in office today for a nurse visit for  Tdap , per PCP's order. Patient Injection was given in the  Left deltoid. Patient tolerated injection well.

## 2023-05-24 ENCOUNTER — Other Ambulatory Visit: Payer: Self-pay | Admitting: Physician Assistant

## 2023-05-24 DIAGNOSIS — F430 Acute stress reaction: Secondary | ICD-10-CM

## 2023-05-24 NOTE — Telephone Encounter (Signed)
 Pt requesting refill for Alprazolam  0.25 mg. Last OV 08/2022.

## 2023-05-29 ENCOUNTER — Telehealth: Admitting: Family

## 2023-05-29 DIAGNOSIS — L739 Follicular disorder, unspecified: Secondary | ICD-10-CM | POA: Diagnosis not present

## 2023-05-29 MED ORDER — CEPHALEXIN 500 MG PO CAPS
500.0000 mg | ORAL_CAPSULE | Freq: Three times a day (TID) | ORAL | 0 refills | Status: AC
Start: 2023-05-29 — End: 2023-06-05

## 2023-05-29 NOTE — Progress Notes (Signed)
 E Visit for Rash  We are sorry that you are not feeling well. Here is how we plan to help!   Based upon what you have shared with me it looks like you have a bacterial follicultits.  Folliculitis is inflammation of the hair follicles that can be caused by a superficial infection of the skin and is treated with an antibiotic. I have prescribed: and Keflex  500 mg three times per day for 7 days   HOME CARE:  Take cool showers and avoid direct sunlight. Apply cool compress or wet dressings. Take a bath in an oatmeal bath.  Sprinkle content of one Aveeno packet under running faucet with comfortably warm water.  Bathe for 15-20 minutes, 1-2 times daily.  Pat dry with a towel. Do not rub the rash. Use hydrocortisone cream. Take an antihistamine like Benadryl for widespread rashes that itch.  The adult dose of Benadryl is 25-50 mg by mouth 4 times daily. Caution:  This type of medication may cause sleepiness.  Do not drink alcohol, drive, or operate dangerous machinery while taking antihistamines.  Do not take these medications if you have prostate enlargement.  Read package instructions thoroughly on all medications that you take.  GET HELP RIGHT AWAY IF:  Symptoms don't go away after treatment. Severe itching that persists. If you rash spreads or swells. If you rash begins to smell. If it blisters and opens or develops a yellow-brown crust. You develop a fever. You have a sore throat. You become short of breath.  MAKE SURE YOU:  Understand these instructions. Will watch your condition. Will get help right away if you are not doing well or get worse.  Thank you for choosing an e-visit.  Your e-visit answers were reviewed by a board certified advanced clinical practitioner to complete your personal care plan. Depending upon the condition, your plan could have included both over the counter or prescription medications.  Please review your pharmacy choice. Make sure the pharmacy is open  so you can pick up prescription now. If there is a problem, you may contact your provider through [employer] and have the prescription routed to another pharmacy.  Your safety is important to us . If you have drug allergies check your prescription carefully.   For the next 24 hours you can use MyChart to ask questions about today's visit, request a non-urgent call back, or ask for a work or school excuse. You will get an email in the next two days asking about your experience. I hope that your e-visit has been valuable and will speed your recovery.   Approximately 5 minutes was spent documenting and reviewing patient's chart.

## 2023-09-13 DIAGNOSIS — M7711 Lateral epicondylitis, right elbow: Secondary | ICD-10-CM | POA: Insufficient documentation

## 2023-09-14 ENCOUNTER — Other Ambulatory Visit: Payer: Self-pay | Admitting: Physician Assistant

## 2023-10-16 ENCOUNTER — Other Ambulatory Visit: Payer: Self-pay | Admitting: Physician Assistant

## 2023-10-20 ENCOUNTER — Other Ambulatory Visit: Payer: Self-pay | Admitting: Physician Assistant

## 2023-10-20 DIAGNOSIS — I10 Essential (primary) hypertension: Secondary | ICD-10-CM

## 2023-10-24 ENCOUNTER — Other Ambulatory Visit: Payer: Self-pay | Admitting: Physician Assistant

## 2023-10-24 DIAGNOSIS — I10 Essential (primary) hypertension: Secondary | ICD-10-CM

## 2023-10-28 ENCOUNTER — Encounter: Payer: Self-pay | Admitting: Physician Assistant

## 2023-10-31 ENCOUNTER — Other Ambulatory Visit: Payer: Self-pay | Admitting: Physician Assistant

## 2023-10-31 ENCOUNTER — Encounter: Payer: Self-pay | Admitting: Physician Assistant

## 2023-10-31 ENCOUNTER — Ambulatory Visit (INDEPENDENT_AMBULATORY_CARE_PROVIDER_SITE_OTHER): Admitting: Physician Assistant

## 2023-10-31 VITALS — BP 140/96 | HR 88 | Temp 98.2°F | Ht 67.25 in | Wt 199.5 lb

## 2023-10-31 DIAGNOSIS — E781 Pure hyperglyceridemia: Secondary | ICD-10-CM | POA: Diagnosis not present

## 2023-10-31 DIAGNOSIS — F410 Panic disorder [episodic paroxysmal anxiety] without agoraphobia: Secondary | ICD-10-CM

## 2023-10-31 DIAGNOSIS — F430 Acute stress reaction: Secondary | ICD-10-CM

## 2023-10-31 DIAGNOSIS — I10 Essential (primary) hypertension: Secondary | ICD-10-CM

## 2023-10-31 MED ORDER — LOSARTAN POTASSIUM 50 MG PO TABS: ORAL_TABLET | ORAL | 3 refills | Status: AC

## 2023-10-31 MED ORDER — AMLODIPINE BESYLATE 10 MG PO TABS: 10.0000 mg | ORAL_TABLET | Freq: Every day | ORAL | 3 refills | Status: AC

## 2023-10-31 NOTE — Progress Notes (Signed)
 Michele Cunningham is a 38 y.o. female here for a follow up of a pre-existing problem.  History of Present Illness:   Chief Complaint  Patient presents with   Hypertension    Pt is here for f/u on blood pressure, has not been checking, needs refill.    Discussed the use of AI scribe software for clinical note transcription with the patient, who gave verbal consent to proceed.  History of Present Illness   Michele Cunningham is a 38 year old female who presents for medication management and routine follow-up.  She is currently taking amlodipine  10 mg daily and losartan  50 mg daily for hypertension, with no reported side effects.   She adheres to her medication regimen, taking both in the morning. She is hesitant to undergo blood work, noting it has been a year since her last test, which showed elevated triglycerides.   She requests a refill for Xanax , which she uses occasionally for anxiety and panic attacks. Her anxiety is generally manageable but can become overwhelming at times.        Past Medical History:  Diagnosis Date   Anxiety    Hypertension      Social History   Tobacco Use   Smoking status: Never   Smokeless tobacco: Never  Vaping Use   Vaping status: Never Used  Substance Use Topics   Alcohol use: Yes    Alcohol/week: 3.0 - 4.0 standard drinks of alcohol    Types: 3 - 4 Glasses of wine per week   Drug use: Never    No past surgical history on file.  Family History  Problem Relation Age of Onset   Cancer Mother        skin   Hypertension Mother    Hypertension Sister    Breast cancer Neg Hx    Colon cancer Neg Hx     No Known Allergies  Current Medications:   Current Outpatient Medications:    ALPRAZolam  (XANAX ) 0.25 MG tablet, TAKE 1 TABLET(0.25 MG) BY MOUTH TWICE DAILY AS NEEDED FOR ANXIETY, Disp: 20 tablet, Rfl: 0   meloxicam (MOBIC) 15 MG tablet, Take 15 mg by mouth daily., Disp: , Rfl:    nystatin  cream (MYCOSTATIN ), Apply 1 application topically 2  (two) times daily., Disp: 30 g, Rfl: 0   triamcinolone  cream (KENALOG ) 0.1 %, Apply to affected area 1-2 times daily, Disp: 30 g, Rfl: 0   amLODipine  (NORVASC ) 10 MG tablet, Take 1 tablet (10 mg total) by mouth daily., Disp: 90 tablet, Rfl: 3   losartan  (COZAAR ) 50 MG tablet, TAKE 1 TABLET(50 MG) BY MOUTH DAILY, Disp: 90 tablet, Rfl: 3   Review of Systems:   Negative unless otherwise specified per HPI.  Vitals:   Vitals:   10/31/23 1422 10/31/23 1436  BP: (!) 146/88 (!) 140/96  Pulse: 88   Temp: 98.2 F (36.8 C)   TempSrc: Temporal   SpO2: 97%   Weight: 199 lb 8 oz (90.5 kg)   Height: 5' 7.25 (1.708 m)      Body mass index is 31.01 kg/m.  Physical Exam:   Physical Exam Vitals and nursing note reviewed.  Constitutional:      General: She is not in acute distress.    Appearance: She is well-developed. She is not ill-appearing or toxic-appearing.  Cardiovascular:     Rate and Rhythm: Normal rate and regular rhythm.     Pulses: Normal pulses.     Heart sounds: Normal heart sounds, S1  normal and S2 normal.  Pulmonary:     Effort: Pulmonary effort is normal.     Breath sounds: Normal breath sounds.  Skin:    General: Skin is warm and dry.  Neurological:     Mental Status: She is alert.     GCS: GCS eye subscore is 4. GCS verbal subscore is 5. GCS motor subscore is 6.  Psychiatric:        Speech: Speech normal.        Behavior: Behavior normal. Behavior is cooperative.     Assessment and Plan:   Assessment and Plan    Panic attack as reaction to stress Managed with Xanax  as needed. Occasional use reported. - Refill Xanax  0.25 mg daily as needed prescription. - reports there is no suicidal ideation/hi   Essential hypertension With amlodipine  and losartan . No side effects reported. - Continue amlodipine  10 mg and losartan  50 mg. - recommend close follow up if blood pressure in a few months remains elevated   High triglycerides Will update lipid panel when  fasting Will also update Hemoglobin A1c as well  General Health Maintenance Mammogram due at age 57. Fasting blood work needed for triglycerides. - Schedule fasting blood work for triglycerides. - Schedule mammogram before age 69. - Provide mammogram scheduling information via MyChart.      Lucie Buttner, PA-C

## 2023-10-31 NOTE — Telephone Encounter (Signed)
 Pt requesting refill for Alprazolam  0.25 mg. Last OV today 10/20.

## 2023-10-31 NOTE — Patient Instructions (Signed)
 Please plan for fasting labs (you can have water or black coffee)   You can call our office to schedule blood work here or you can walk into our other [facility] lab without an appointment. If you choose to walk in, please go to [address] located in the [facility]. It is across the street from [facility]. Lab (and xray) are located in the basement.  Hours of operation are M-F 8:30am to 5:00pm. (Please note that they are closed for lunch between 12:30 and 1:00pm.)  Your blood pressure is elevated in our office today.  I recommend that you monitor this at home.  Your goal blood pressure should be around < 130/80, unless you are over 19 years old, your goal may be closer to 140-150/90. Please note if you have been given other goals from a cardiologist or other healthcare provider, please defer to their recommendations.  When preparing to take your blood pressure: Plan ahead. Don't smoke, drink caffeine or exercise within 30 minutes before taking your blood pressure. Empty your bladder. Don't take the measurement over clothes. Remove the clothing over the arm that will be used to measure blood pressure. You can use either arm unless otherwise told by a healthcare provider. Usually there is not a big difference between readings on them. Be still. Allow at least five minutes of quiet rest before measurements. Don't talk or use the phone. Sit correctly. Sit with your back straight and supported (on a dining chair, rather than a sofa). Your feet should be flat on the floor. Do not cross your legs. Support your arm on a flat surface. The middle of the cuff should be placed on the upper arm at heart level.  Measure at the same time of the day. Take multiple readings and record the results. Each time you measure, take two readings one minute apart. Record the results and bring in to your next office visit.  In order to know how well the medication is working, I would  like you to take your readings 1-2 hours after taking your blood pressure medication if possible. Take your blood pressure measurements and record 2-3 days per week.  If you get a high blood pressure reading: A single high reading is not an immediate cause for alarm. If you get a reading that is higher than normal, take your blood pressure a second time. Write down the results of both measurements. Check with your health care professional to see if there's a health concern or whether there may be problems with your monitor. If your blood pressure readings are suddenly higher than 180/120 mm Hg, wait at least one minute and test again. If your readings are still very high, contact your health care professional immediately. You could be having a hypertensive crisis. Call 911 if your blood pressure is higher than 180/120 mm Hg and if you are having new signs or symptoms that may include: Chest pain Shortness of breath Back pain Numbness Weakness Change in vision Difficulty speaking Confusion Dizziness Vomiting

## 2023-11-08 ENCOUNTER — Other Ambulatory Visit (INDEPENDENT_AMBULATORY_CARE_PROVIDER_SITE_OTHER)

## 2023-11-08 DIAGNOSIS — I10 Essential (primary) hypertension: Secondary | ICD-10-CM | POA: Diagnosis not present

## 2023-11-08 DIAGNOSIS — E781 Pure hyperglyceridemia: Secondary | ICD-10-CM

## 2023-11-08 LAB — CBC WITH DIFFERENTIAL/PLATELET
Basophils Absolute: 0.1 K/uL (ref 0.0–0.1)
Basophils Relative: 0.6 % (ref 0.0–3.0)
Eosinophils Absolute: 0.3 K/uL (ref 0.0–0.7)
Eosinophils Relative: 3.2 % (ref 0.0–5.0)
HCT: 41.1 % (ref 36.0–46.0)
Hemoglobin: 13.6 g/dL (ref 12.0–15.0)
Lymphocytes Relative: 39 % (ref 12.0–46.0)
Lymphs Abs: 3.6 K/uL (ref 0.7–4.0)
MCHC: 33.2 g/dL (ref 30.0–36.0)
MCV: 85.7 fl (ref 78.0–100.0)
Monocytes Absolute: 0.5 K/uL (ref 0.1–1.0)
Monocytes Relative: 5.9 % (ref 3.0–12.0)
Neutro Abs: 4.7 K/uL (ref 1.4–7.7)
Neutrophils Relative %: 51.3 % (ref 43.0–77.0)
Platelets: 392 K/uL (ref 150.0–400.0)
RBC: 4.8 Mil/uL (ref 3.87–5.11)
RDW: 13.2 % (ref 11.5–15.5)
WBC: 9.1 K/uL (ref 4.0–10.5)

## 2023-11-08 LAB — LIPID PANEL
Cholesterol: 187 mg/dL (ref 0–200)
HDL: 47 mg/dL (ref >39.00)
LDL Cholesterol: 80 mg/dL (ref 0–99)
NonHDL: 140.34
Total CHOL/HDL Ratio: 4
Triglycerides: 304 mg/dL — ABNORMAL HIGH (ref 0.0–149.0)
VLDL: 60.8 mg/dL — ABNORMAL HIGH (ref 0.0–40.0)

## 2023-11-08 LAB — COMPREHENSIVE METABOLIC PANEL WITH GFR
ALT: 28 U/L (ref 0–35)
AST: 33 U/L (ref 0–37)
Albumin: 5 g/dL (ref 3.5–5.2)
Alkaline Phosphatase: 39 U/L (ref 39–117)
BUN: 12 mg/dL (ref 6–23)
CO2: 23 meq/L (ref 19–32)
Calcium: 9.6 mg/dL (ref 8.4–10.5)
Chloride: 103 meq/L (ref 96–112)
Creatinine, Ser: 0.75 mg/dL (ref 0.40–1.20)
GFR: 99.82 mL/min (ref >60.00)
Glucose, Bld: 93 mg/dL (ref 70–99)
Potassium: 3.6 meq/L (ref 3.5–5.1)
Sodium: 136 meq/L (ref 135–145)
Total Bilirubin: 0.6 mg/dL (ref 0.2–1.2)
Total Protein: 8.1 g/dL (ref 6.0–8.3)

## 2023-11-08 LAB — HEMOGLOBIN A1C: Hgb A1c MFr Bld: 5.6 % (ref 4.6–6.5)

## 2023-11-09 ENCOUNTER — Ambulatory Visit: Payer: Self-pay | Admitting: Physician Assistant
